# Patient Record
Sex: Female | Born: 1960 | Race: White | Hispanic: No | Smoking: Never smoker
Health system: Southern US, Community
[De-identification: ages and names within clinical notes are randomized; demographics above are authoritative.]

## PROBLEM LIST (undated history)

## (undated) DIAGNOSIS — F32A Depression, unspecified: Secondary | ICD-10-CM

## (undated) DIAGNOSIS — E079 Disorder of thyroid, unspecified: Secondary | ICD-10-CM

## (undated) DIAGNOSIS — F329 Major depressive disorder, single episode, unspecified: Secondary | ICD-10-CM

## (undated) HISTORY — PX: OTHER SURGICAL HISTORY: SHX169

## (undated) HISTORY — DX: Disorder of thyroid, unspecified: E07.9

## (undated) HISTORY — DX: Major depressive disorder, single episode, unspecified: F32.9

## (undated) HISTORY — DX: Depression, unspecified: F32.A

---

## 1999-01-30 ENCOUNTER — Other Ambulatory Visit: Admission: RE | Admit: 1999-01-30 | Discharge: 1999-01-30 | Payer: Self-pay | Admitting: Obstetrics and Gynecology

## 1999-12-17 ENCOUNTER — Ambulatory Visit (HOSPITAL_COMMUNITY): Admission: RE | Admit: 1999-12-17 | Discharge: 1999-12-17 | Payer: Self-pay | Admitting: Obstetrics and Gynecology

## 1999-12-17 ENCOUNTER — Encounter (INDEPENDENT_AMBULATORY_CARE_PROVIDER_SITE_OTHER): Payer: Self-pay

## 2000-01-23 ENCOUNTER — Encounter: Admission: RE | Admit: 2000-01-23 | Discharge: 2000-01-23 | Payer: Self-pay | Admitting: Sports Medicine

## 2000-02-16 ENCOUNTER — Encounter: Admission: RE | Admit: 2000-02-16 | Discharge: 2000-02-16 | Payer: Self-pay | Admitting: Family Medicine

## 2000-03-08 ENCOUNTER — Other Ambulatory Visit: Admission: RE | Admit: 2000-03-08 | Discharge: 2000-03-08 | Payer: Self-pay | Admitting: Obstetrics and Gynecology

## 2000-03-19 ENCOUNTER — Encounter: Admission: RE | Admit: 2000-03-19 | Discharge: 2000-03-19 | Payer: Self-pay | Admitting: Sports Medicine

## 2000-10-11 ENCOUNTER — Ambulatory Visit (HOSPITAL_COMMUNITY): Admission: RE | Admit: 2000-10-11 | Discharge: 2000-10-11 | Payer: Self-pay | Admitting: Endocrinology

## 2000-10-12 ENCOUNTER — Encounter: Payer: Self-pay | Admitting: Endocrinology

## 2001-05-24 ENCOUNTER — Other Ambulatory Visit: Admission: RE | Admit: 2001-05-24 | Discharge: 2001-05-24 | Payer: Self-pay | Admitting: Obstetrics and Gynecology

## 2003-02-20 ENCOUNTER — Other Ambulatory Visit: Admission: RE | Admit: 2003-02-20 | Discharge: 2003-02-20 | Payer: Self-pay | Admitting: Obstetrics and Gynecology

## 2003-12-14 ENCOUNTER — Encounter: Admission: RE | Admit: 2003-12-14 | Discharge: 2003-12-14 | Payer: Self-pay | Admitting: Sports Medicine

## 2004-02-05 ENCOUNTER — Encounter (INDEPENDENT_AMBULATORY_CARE_PROVIDER_SITE_OTHER): Payer: Self-pay | Admitting: *Deleted

## 2004-02-05 ENCOUNTER — Ambulatory Visit (HOSPITAL_COMMUNITY): Admission: RE | Admit: 2004-02-05 | Discharge: 2004-02-05 | Payer: Self-pay | Admitting: Plastic Surgery

## 2004-02-05 ENCOUNTER — Ambulatory Visit (HOSPITAL_BASED_OUTPATIENT_CLINIC_OR_DEPARTMENT_OTHER): Admission: RE | Admit: 2004-02-05 | Discharge: 2004-02-05 | Payer: Self-pay | Admitting: Plastic Surgery

## 2008-01-03 ENCOUNTER — Ambulatory Visit: Payer: Self-pay | Admitting: Sports Medicine

## 2008-01-03 DIAGNOSIS — M25559 Pain in unspecified hip: Secondary | ICD-10-CM | POA: Insufficient documentation

## 2008-01-03 DIAGNOSIS — M7750 Other enthesopathy of unspecified foot: Secondary | ICD-10-CM | POA: Insufficient documentation

## 2008-11-22 ENCOUNTER — Ambulatory Visit: Payer: Self-pay | Admitting: Sports Medicine

## 2008-11-22 DIAGNOSIS — M2170 Unequal limb length (acquired), unspecified site: Secondary | ICD-10-CM | POA: Insufficient documentation

## 2008-12-05 ENCOUNTER — Encounter: Admission: RE | Admit: 2008-12-05 | Discharge: 2008-12-05 | Payer: Self-pay | Admitting: Psychiatry

## 2008-12-05 ENCOUNTER — Ambulatory Visit: Payer: Self-pay | Admitting: Sports Medicine

## 2008-12-13 ENCOUNTER — Telehealth (INDEPENDENT_AMBULATORY_CARE_PROVIDER_SITE_OTHER): Payer: Self-pay | Admitting: *Deleted

## 2008-12-15 ENCOUNTER — Encounter: Admission: RE | Admit: 2008-12-15 | Discharge: 2008-12-15 | Payer: Self-pay | Admitting: Sports Medicine

## 2008-12-24 ENCOUNTER — Ambulatory Visit: Payer: Self-pay | Admitting: Sports Medicine

## 2008-12-24 DIAGNOSIS — M84353A Stress fracture, unspecified femur, initial encounter for fracture: Secondary | ICD-10-CM | POA: Insufficient documentation

## 2009-01-11 ENCOUNTER — Encounter: Admission: RE | Admit: 2009-01-11 | Discharge: 2009-01-11 | Payer: Self-pay | Admitting: Family Medicine

## 2009-01-14 ENCOUNTER — Ambulatory Visit: Payer: Self-pay | Admitting: Sports Medicine

## 2009-02-11 ENCOUNTER — Ambulatory Visit: Payer: Self-pay | Admitting: Sports Medicine

## 2010-07-21 ENCOUNTER — Encounter: Payer: Self-pay | Admitting: Gastroenterology

## 2010-07-31 NOTE — Letter (Signed)
Summary: [facility] Instructions  [facility]  [address] [place], [place] [address]   Phone: [phone]  Fax: [phone]       Wanda Riley    03/01/1961    MRN: [id number]        Procedure Day Dorna Bloom:  Duanne Limerick  08/04/10     Arrival Time:  8:30AM     Procedure Time:  9:30AM     [facility] of Procedure:                    [facility] ([facility])                      PREPARATION FOR COLONOSCOPY WITH MOVIPREP   Starting 5 days prior to your procedure 07/30/10 do not eat nuts, seeds, popcorn, corn, beans, peas,  salads, or any raw vegetables.  Do not take any fiber supplements (e.g. Metamucil, Citrucel, and Benefiber).  THE DAY BEFORE YOUR PROCEDURE         DATE: 08/03/10   DAY: SUNDAY  1.  Drink clear liquids the entire day-NO SOLID FOOD  2.  Do not drink anything colored red or purple.  Avoid juices with pulp.  No orange juice.  3.  Drink at least 64 oz. (8 glasses) of fluid/clear liquids during the day to prevent dehydration and help the prep work efficiently.  CLEAR LIQUIDS INCLUDE: Water Jello Ice Popsicles Tea (sugar ok, no milk/cream) Powdered fruit flavored drinks Coffee (sugar ok, no milk/cream) Gatorade Juice: apple, white grape, white cranberry  Lemonade Clear bullion, consomm, broth Carbonated beverages (any kind) Strained chicken noodle soup Hard Candy                             4.  In the morning, mix first dose of MoviPrep solution:    Empty 1 Pouch A and 1 Pouch B into the disposable container    Add lukewarm drinking water to the top line of the container. Mix to dissolve    Refrigerate (mixed solution should be used within 24 hrs)  5.  Begin drinking the prep at 5:00 p.m. The MoviPrep container is divided by 4 marks.   Every 15 minutes drink the solution down to the next mark (approximately 8 oz) until the full liter is complete.   6.  Follow completed prep with 16 oz of clear liquid of your choice (Nothing red or  purple).  Continue to drink clear liquids until bedtime.  7.  Before going to bed, mix second dose of MoviPrep solution:    Empty 1 Pouch A and 1 Pouch B into the disposable container    Add lukewarm drinking water to the top line of the container. Mix to dissolve    Refrigerate  THE DAY OF YOUR PROCEDURE      DATE: 08/04/10  DAY: MONDAY  Beginning at 4:30AM (5 hours before procedure):         1. Every 15 minutes, drink the solution down to the next mark (approx 8 oz) until the full liter is complete.  2. Follow completed prep with 16 oz. of clear liquid of your choice.    3. You may drink clear liquids until 7:30AM (2 HOURS BEFORE PROCEDURE).   MEDICATION INSTRUCTIONS  Unless otherwise instructed, you should take regular prescription medications with a small sip of water   as early as possible the morning of  your procedure.       OTHER INSTRUCTIONS  You will need a responsible adult at least 51 years of age to accompany you and drive you home.   This person must remain in the waiting room during your procedure.  Wear loose fitting clothing that is easily removed.  Leave jewelry and other valuables at home.  However, you may wish to bring a book to read or  an iPod/MP3 player to listen to music as you wait for your procedure to start.  Remove all body piercing jewelry and leave at home.  Total time from sign-in until discharge is approximately 2-3 hours.  You should go home directly after your procedure and rest.  You can resume normal activities the  day after your procedure.  The day of your procedure you should not:   Drive   Make legal decisions   Operate machinery   Drink alcohol   Return to work  You will receive specific instructions about eating, activities and medications before you leave.    The above instructions have been reviewed and explained to me by   Doristine Church RN II  July 21, 2010 11:39 AM _______________________    I fully  understand and can verbalize these instructions _____________________________ Date _________

## 2010-07-31 NOTE — Miscellaneous (Signed)
Summary: [facility]  Clinical Lists Changes  Medications: Added new medication of MOVIPREP 100 GM  SOLR (PEG-KCL-NACL-NASULF-NA ASC-C) As per prep instructions. - Signed Rx of MOVIPREP 100 GM  SOLR (PEG-KCL-NACL-NASULF-NA ASC-C) As per prep instructions.;  #1 x 0;  Signed;  Entered by: Doristine Church RN II;  Authorized by: Mardella Layman MD [facility];  Method used: Electronically to [facility] [address], [place], [place], [place]  [address], Ph: [phone], Fax: [phone] Observations: Added new observation of ALLERGY REV: Done (07/21/2010 11:18) Added new observation of NKA: T (07/21/2010 11:18)    Prescriptions: MOVIPREP 100 GM  SOLR (PEG-KCL-NACL-NASULF-NA ASC-C) As per prep instructions.  #1 x 0   Entered by:   Doristine Church RN II   Authorized by:   Mardella Layman MD [facility]   Signed by:   Doristine Church RN II on 07/21/2010   Method used:   Electronically to        [facility]       [address]       [place], [place]  [address]       Ph: [phone]       Fax: [phone]   RxID:   [id number]

## 2010-08-04 ENCOUNTER — Encounter: Payer: Self-pay | Admitting: Gastroenterology

## 2010-08-04 ENCOUNTER — Ambulatory Visit: Payer: BC Managed Care – PPO | Admitting: Gastroenterology

## 2010-08-04 VITALS — BP 106/69 | HR 68 | Temp 98.2°F | Resp 18 | Ht 65.5 in | Wt 160.0 lb

## 2010-08-04 DIAGNOSIS — D126 Benign neoplasm of colon, unspecified: Secondary | ICD-10-CM

## 2010-08-04 DIAGNOSIS — Z1211 Encounter for screening for malignant neoplasm of colon: Secondary | ICD-10-CM

## 2010-08-04 DIAGNOSIS — K635 Polyp of colon: Secondary | ICD-10-CM

## 2010-08-04 DIAGNOSIS — R6889 Other general symptoms and signs: Secondary | ICD-10-CM

## 2010-08-04 NOTE — Patient Instructions (Signed)
Discharge instructions given with verbal understanding. Handout on polyps given. 

## 2010-08-05 ENCOUNTER — Telehealth: Payer: Self-pay

## 2010-08-05 NOTE — Telephone Encounter (Signed)

## 2010-08-11 ENCOUNTER — Encounter: Payer: Self-pay | Admitting: Gastroenterology

## 2010-09-19 NOTE — Op Note (Signed)
Wanda Riley, Wanda Riley                 ACCOUNT NO.:  [id number]   MEDICAL RECORD NO.:  [id number]          PATIENT TYPE:  AMB   LOCATION:  DSC                          FACILITY:  [facility]   PHYSICIAN:  Alfredia Ferguson, M.D.  DATE OF BIRTH:  1960/08/12   DATE OF PROCEDURE:  02/05/2004  DATE OF DISCHARGE:                                 OPERATIVE REPORT   PREOPERATIVE DIAGNOSIS:  A 1 cm sebaceous cyst, presternal area.   POSTOPERATIVE DIAGNOSIS:  A 1 cm sebaceous cyst, presternal area.   OPERATION PERFORMED:  Excision, cyst, presternal area.   SURGEON:  Alfredia Ferguson, M.D.   ANESTHESIA:  2% Xylocaine, 1:100,000 epinephrine.   INDICATIONS FOR PROCEDURE:  The patient is a 50 year old woman with slowly  enlarging subcutaneous mass.  Clinically, it appears to be a sebaceous cyst.  It has been infected in the past.  She would like to have it removed before  it gets infected again.  The patient understands she will be trading what  she has for a permanent and potentially unsightly scar.  In spite of that,  she wishes to proceed with the surgery.   DESCRIPTION OF PROCEDURE:  Skin markers were placed in elliptical fashion  around the lesion to include the diseased punctum.  Local anesthesia was  infiltrated and the area was prepped with Betadine and draped with sterile  drapes.  An elliptical incision in the skin was made and deepened until  reaching the capsule of the cyst.  Using sharp dissection, the cyst was  dissected away from the surrounding tissue.  The cyst was removed in toto.  Hemostasis was accomplished using pressure.  The wound was closed using  interrupted 5-0 Monocryl sutures for the dermis.  The area was cleansed and  dried and Steri-Strips were applied to the incision.  Light dressing was  applied.  The patient was discharged to home in satisfactory condition.       WBB/MEDQ  D:  02/05/2004  T:  02/05/2004  Job:  [id number]

## 2010-09-19 NOTE — Op Note (Signed)
[facility]  Patient:    Wanda Riley, Wanda Riley                        MRN: [id number] Proc. Date: 12/17/99 Adm. Date:  [id number] Attending:  Morene Antu                           Operative Report  PREOPERATIVE DIAGNOSES:       1. Menorrhagia.                               2. Thickened endometrium.  POSTOPERATIVE DIAGNOSES:      1. Menorrhagia.                               2. Thickened endometrium.                               3. Cystic polypoid endometrium.  PROCEDURE:                    1. Dilatation and curettage.                               2. Hysteroscopy with resectoscope.  SURGEON:                      Sherry A. Rosalio Macadamia, M.D.  ANESTHESIA:                   General.  INDICATIONS:                  This is a 50 year old G 4, P 4, 0, 0, 4 woman who has been having excessively heavy flow with her menstrual period.  She has been having more frequent menstrual periods and also lasting significantly longer.  In July the patient had an 18-day cycle.  The patient was treated with Lo/Ovral to try to decrease her bleeding; however, this was not adequate. Because of this, the patient had an ultrasound which revealed a slightly enlarged uterus with a thickened endometrium approximately 18.0 mm.  Because of this, the patient is brought to the operating room for a D&C, hysteroscopy, with resectoscope.  FINDINGS:                     An anteflexed uterus approximately nine weeks size, normal adnexa, excessive cystic polypoid endometrial tissue present.  DESCRIPTION OF PROCEDURE:     The patient was brought into the operating room and given adequate general anesthesia.  She was placed in the dorsal lithotomy position.  Her perineum and vagina were washed with Betadine.  A pelvic examination was performed.  The bladder was felt to be empty.  The patient was draped in a sterile fashion.  The speculum was  placed within the vagina.  The cervix was  grasped with a single-tooth tenaculum.  The cervix was sounded and dilated with Pratt dilators to a #31.  The hysteroscope was easily introduced into the endometrial cavity.  Pictures were obtained using a double loop right angle resector.  The endometrial tissue was resected in sheets circumferentially.  Several layers of tissue were needed to be resected to be  able to get through the endometrial tissue.  Once resection was completed circumferentially, all instruments were removed from the vagina.  The patient was taken out of the dorsal lithotomy position.  The patient was awakened. She was extubated.  She was moved from the operating room table to a stretcher in stable condition.  COMPLICATIONS:                None.  ESTIMATED BLOOD LOSS:         5 cc.  This is Sorbitol differential -130. DD:  12/17/99 TD:  12/17/99 Job: [id number]

## 2011-04-17 ENCOUNTER — Encounter (INDEPENDENT_AMBULATORY_CARE_PROVIDER_SITE_OTHER): Payer: BC Managed Care – PPO

## 2011-04-17 DIAGNOSIS — E039 Hypothyroidism, unspecified: Secondary | ICD-10-CM

## 2011-05-28 ENCOUNTER — Encounter (INDEPENDENT_AMBULATORY_CARE_PROVIDER_SITE_OTHER): Payer: BC Managed Care – PPO | Admitting: Family Medicine

## 2011-05-28 ENCOUNTER — Encounter: Payer: Self-pay | Admitting: Family Medicine

## 2011-05-28 DIAGNOSIS — F32A Depression, unspecified: Secondary | ICD-10-CM

## 2011-05-28 DIAGNOSIS — Z0000 Encounter for general adult medical examination without abnormal findings: Secondary | ICD-10-CM

## 2011-05-28 DIAGNOSIS — F329 Major depressive disorder, single episode, unspecified: Secondary | ICD-10-CM | POA: Insufficient documentation

## 2011-05-28 DIAGNOSIS — Z23 Encounter for immunization: Secondary | ICD-10-CM

## 2011-05-28 DIAGNOSIS — E039 Hypothyroidism, unspecified: Secondary | ICD-10-CM

## 2012-05-07 ENCOUNTER — Other Ambulatory Visit: Payer: Self-pay | Admitting: Family Medicine

## 2012-05-07 NOTE — Telephone Encounter (Signed)
Please pull chart.

## 2012-05-09 NOTE — Telephone Encounter (Signed)
Chart pulled to PA pool at nurses station [phone]

## 2012-06-09 ENCOUNTER — Telehealth: Payer: Self-pay | Admitting: *Deleted

## 2012-06-09 ENCOUNTER — Other Ambulatory Visit: Payer: Self-pay | Admitting: *Deleted

## 2012-06-09 MED ORDER — LEVOTHYROXINE SODIUM 75 MCG PO TABS: 75.0000 ug | ORAL_TABLET | Freq: Every day | ORAL | Status: DC

## 2012-06-09 MED ORDER — BUPROPION HCL ER (XL) 150 MG PO TB24: 150.0000 mg | ORAL_TABLET | Freq: Every day | ORAL | Status: DC

## 2012-06-09 NOTE — Telephone Encounter (Signed)
[facility] requesting refill on bupropion xl 150.  Last fill 05/07/12

## 2012-06-15 ENCOUNTER — Ambulatory Visit (INDEPENDENT_AMBULATORY_CARE_PROVIDER_SITE_OTHER): Payer: BC Managed Care – PPO | Admitting: Family Medicine

## 2012-06-15 ENCOUNTER — Encounter: Payer: Self-pay | Admitting: Family Medicine

## 2012-06-15 VITALS — BP 100/66 | HR 77 | Temp 98.2°F | Resp 16 | Ht 65.0 in | Wt 159.0 lb

## 2012-06-15 DIAGNOSIS — F418 Other specified anxiety disorders: Secondary | ICD-10-CM

## 2012-06-15 DIAGNOSIS — F341 Dysthymic disorder: Secondary | ICD-10-CM

## 2012-06-15 DIAGNOSIS — E039 Hypothyroidism, unspecified: Secondary | ICD-10-CM

## 2012-06-15 DIAGNOSIS — E7800 Pure hypercholesterolemia, unspecified: Secondary | ICD-10-CM

## 2012-06-15 LAB — COMPREHENSIVE METABOLIC PANEL
ALT: 12 U/L (ref 0–35)
AST: 16 U/L (ref 0–37)
Alkaline Phosphatase: 70 U/L (ref 39–117)
CO2: 30 mEq/L (ref 19–32)
Sodium: 141 mEq/L (ref 135–145)
Total Bilirubin: 0.5 mg/dL (ref 0.3–1.2)
Total Protein: 6.7 g/dL (ref 6.0–8.3)

## 2012-06-15 LAB — LIPID PANEL
LDL Cholesterol: 156 mg/dL — ABNORMAL HIGH (ref 0–99)
VLDL: 17 mg/dL (ref 0–40)

## 2012-06-15 LAB — TSH: TSH: 1.447 u[IU]/mL (ref 0.350–4.500)

## 2012-06-15 LAB — T3, FREE: T3, Free: 2.9 pg/mL (ref 2.3–4.2)

## 2012-06-15 MED ORDER — BUPROPION HCL ER (XL) 150 MG PO TB24: 150.0000 mg | ORAL_TABLET | Freq: Every day | ORAL | Status: DC

## 2012-06-15 MED ORDER — LEVOTHYROXINE SODIUM 75 MCG PO TABS: 75.0000 ug | ORAL_TABLET | Freq: Every day | ORAL | Status: DC

## 2012-06-15 NOTE — Patient Instructions (Addendum)

## 2012-06-15 NOTE — Progress Notes (Signed)
S:  This 52 y.o. Cauc female has hypothyroidism, chronic depression and elevated cholesterol (most likely familial- father has high cholesterol). She feels good but has a "cold" today and feels like she is overweight. She would like to lose  ~ 10 -15 pounds in next 3 months. She is compliant w/ all medications and sees a therapist as part of treatment for depression. She is employed by [employer] and will be traveling to [place] in May; she handles job-related stress well and is sleeping well. Pt has some concern about elevated cholesterol but is aware that HDL if high; there is a familial component since one of her parents has hypercholesterolemia. She is not interested in taking a statin.  ROS: As per HPI; negative for fatigue, fever/chills, unexplained weight change, CP or tightness, palpitations, SOB or DOE, constipation, skin changes, HA, dizziness, weakness or syncope. She has no SI/HI, agitation or concentration problems.  O: Filed Vitals:   06/15/12 0748  BP: 100/66  Pulse: 77  Temp: 98.2 F (36.8 C)  Resp: 16   GEN: In NAD; WN,WD.  HENT: [place]/AT. EOMI w/ clear conj/sclerae. Wears corrective lenses. EACs normal. Oroph clear and moist. Otherwise normal. COR: RRR. LUNGS: Normal resp rate and effort. SKIN: W&D; no pallor. MS: MAEs; no c/c/e. NEURO: A&O x 3; CNs intact. Nonfocal. Mentation- somewhat flat affect.  Jan 2013 Labs:  TSH= 1.807   TChol= 257   TGs= 77   HDL= 70    LDL= 172    TChol/HDL ratio= 3.7  A/P:  1. Unspecified hypothyroidism  Refill Levothyroxine 75 mcg  1 tab daily #30 w/ 2 RF   TSH   T3, free  2. Hypercholesteremia - risk of CHD ~ 1/2 average Lipid panel   Try OTC Fish Oil- pt given samples of Mega Red Krill Oil caps  3. Depression with anxiety  Refill Bupropion   Comprehensive metabolic panel   Meds ordered this encounter  Medications  . levothyroxine (SYNTHROID, LEVOTHROID) 75 MCG tablet    Sig: Take 1 tablet (75 mcg total) by mouth daily.    Dispense:  30  tablet    Refill:  2  . buPROPion (WELLBUTRIN XL) 150 MG 24 hr tablet    Sig: Take 1 tablet (150 mg total) by mouth daily.    Dispense:  30 tablet    Refill:  5            Follow -up in May 2014 w/ Dr. Milus Glazier for CPE as scheduled.

## 2012-06-18 ENCOUNTER — Encounter: Payer: Self-pay | Admitting: Family Medicine

## 2012-06-19 ENCOUNTER — Other Ambulatory Visit: Payer: Self-pay

## 2012-09-15 ENCOUNTER — Ambulatory Visit (INDEPENDENT_AMBULATORY_CARE_PROVIDER_SITE_OTHER): Payer: BC Managed Care – PPO | Admitting: Family Medicine

## 2012-09-15 ENCOUNTER — Encounter: Payer: Self-pay | Admitting: Family Medicine

## 2012-09-15 VITALS — BP 98/65 | HR 69 | Temp 96.4°F | Resp 16 | Ht 65.25 in | Wt 158.6 lb

## 2012-09-15 DIAGNOSIS — Z0000 Encounter for general adult medical examination without abnormal findings: Secondary | ICD-10-CM

## 2012-09-15 NOTE — Progress Notes (Signed)
  Subjective:    Patient ID: Wanda Riley, female    DOB: 04-14-1961, 52 y.o.   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  Patient had PAP January and was told no need for another x 5 years  Patient had visual disturbance originally thought to be a virus (Dr. Elmer Picker) and later thought to be allergy (Dr. Emily Filbert) and resolving with allergy meds.  Exercise:  Riding bike, usually on weekends  Review of Systems  Eyes: Positive for visual disturbance.       Objective:   Physical Exam Cheerful, good eye contact, appropriate HEENT: unremarkable Chest:  Clear Heart:  Regular, no murmur or gallop Back:  Normal contour, no skin lesions Abdomen: soft, nontender with no HSM, no masses Extremities:  Normal with good pulses, no edema      Assessment & Plan:  Stable. No labs necessary.  We discussed the chronically elevated cholesterol. Recent studies have shown no improvement in treating cholesterol levels and women. Patient has no other risk factors for heart disease and so we agreed to leave this as is, taking only fish oil.  Plan: Yearly followup Patient resistant to mammograms with conflicting data about this is a preventative. She's up-to-date on her colonoscopy. Meds have been refilled.

## 2012-09-21 ENCOUNTER — Other Ambulatory Visit: Payer: Self-pay | Admitting: Family Medicine

## 2013-01-19 ENCOUNTER — Other Ambulatory Visit: Payer: Self-pay | Admitting: Family Medicine

## 2013-03-04 ENCOUNTER — Other Ambulatory Visit: Payer: Self-pay | Admitting: Family Medicine

## 2013-03-09 ENCOUNTER — Other Ambulatory Visit: Payer: Self-pay

## 2013-05-23 ENCOUNTER — Telehealth: Payer: Self-pay

## 2013-05-23 MED ORDER — LEVOTHYROXINE SODIUM 75 MCG PO TABS: ORAL_TABLET | ORAL | Status: DC

## 2013-05-23 NOTE — Telephone Encounter (Signed)
Pharm called reporting that they are sending RF req electronically but we are receiving faxes. Gave OK for 1 RF of levothyroxine on phone w/note that pt needs OV for more.

## 2014-01-16 ENCOUNTER — Ambulatory Visit (INDEPENDENT_AMBULATORY_CARE_PROVIDER_SITE_OTHER): Payer: BC Managed Care – PPO | Admitting: Family Medicine

## 2014-01-16 VITALS — BP 100/76 | HR 63 | Temp 97.6°F | Resp 16 | Ht 65.5 in | Wt 152.4 lb

## 2014-01-16 DIAGNOSIS — J0100 Acute maxillary sinusitis, unspecified: Secondary | ICD-10-CM

## 2014-01-16 DIAGNOSIS — H1045 Other chronic allergic conjunctivitis: Secondary | ICD-10-CM

## 2014-01-16 DIAGNOSIS — H1012 Acute atopic conjunctivitis, left eye: Secondary | ICD-10-CM

## 2014-01-16 MED ORDER — OLOPATADINE HCL 0.1 % OP SOLN: 1.0000 [drp] | Freq: Two times a day (BID) | OPHTHALMIC | Status: DC

## 2014-01-16 MED ORDER — AZITHROMYCIN 250 MG PO TABS: ORAL_TABLET | ORAL | Status: DC

## 2014-01-16 NOTE — Progress Notes (Signed)
Patient ID: Wanda Riley MRN: [id number], DOB: November 27, 1960, 53 y.o. Date of Encounter: 01/16/2014, 5:05 PM  Primary Physician: Robyn Haber, MD  Chief Complaint:  Chief Complaint  Patient presents with  . Facial Pain    pt states her face hurts;  . Sinus Problem    pt states she has lots of pressure  . Eye Pain    pt is having left eye pain  . other    eczema    HPI: 53 y.o. year old female presents with 5 day history of nasal congestion, post nasal drip, sore throat, sinus pressure, and cough. Afebrile. No chills. Nasal congestion thick and green/yellow. Sinus pressure is the worst symptom. Cough is productive secondary to post nasal drip and not associated with time of day. Ears feel full, leading to sensation of muffled hearing. Has tried OTC cold preps without success. No GI complaints.   No recent antibiotics, recent travels, vomiting, or sick contacts   No leg trauma, sedentary periods, h/o cancer, or tobacco use.  Past Medical History  Diagnosis Date  . Depression   . Thyroid disease      Home Meds: Prior to Admission medications   Medication Sig Start Date End Date Taking? Authorizing Provider  levothyroxine (SYNTHROID, LEVOTHROID) 75 MCG tablet TAKE 1 TABLET DAILY. PATIENT NEEDS OFFICE VISIT FOR ADDITIONAL REFILLS 05/23/13  Yes Rise Mu, PA-C    Allergies: No Known Allergies  History   Social History  . Marital Status: Married    Spouse Name: N/A    Number of Children: N/A  . Years of Education: N/A   Occupational History  . Not on file.   Social History Main Topics  . Smoking status: Never Smoker   . Smokeless tobacco: Not on file  . Alcohol Use: 0.5 oz/week    1 drink(s) per week  . Drug Use: No  . Sexual Activity: No   Other Topics Concern  . Not on file   Social History Narrative  . No narrative on file     Review of Systems: Constitutional: negative for chills, fever, night sweats or weight changes Cardiovascular: negative for  chest pain or palpitations Respiratory: negative for hemoptysis, wheezing, or shortness of breath Abdominal: negative for abdominal pain, nausea, vomiting or diarrhea Dermatological: negative for rash Neurologic: negative for headache   Physical Exam: Blood pressure 100/76, pulse 63, temperature 97.6 F (36.4 C), temperature source Oral, resp. rate 16, height 5' 5.5" (1.664 m), weight 152 lb 6.4 oz (69.128 kg), SpO2 99.00%., Body mass index is 24.97 kg/(m^2). General: Well developed, well nourished, in no acute distress. Head: Normocephalic, atraumatic, eyes without discharge, sclera non-icteric, nares are congested. Bilateral auditory canals clear, TM's are without perforation, pearly grey with reflective cone of light bilaterally. Serous effusion bilaterally behind TM's. Maxillary sinus TTP. Oral cavity moist, dentition normal. Posterior pharynx with post nasal drip and mild erythema. No peritonsillar abscess or tonsillar exudate. Neck: Supple. No thyromegaly. Full ROM. No lymphadenopathy. Lungs: Clear bilaterally to auscultation without wheezes, rales, or rhonchi. Breathing is unlabored.  Heart: RRR with S1 S2. No murmurs, rubs, or gallops appreciated. Msk:  Strength and tone normal for age. Extremities: No clubbing or cyanosis. No edema. Neuro: Alert and oriented X 3. Moves all extremities spontaneously. CNII-XII grossly in tact. Psych:  Responds to questions appropriately with a normal affect.     ASSESSMENT AND PLAN:  53 y.o. year old female with sinusitis -Allergic conjunctivitis, left - Plan: olopatadine (PATANOL) 0.1 %  ophthalmic solution  Acute maxillary sinusitis, recurrence not specified - Plan: azithromycin (ZITHROMAX Z-PAK) 250 MG tablet    -Tylenol/Motrin prn -Rest/fluids -RTC precautions -RTC 3-5 days if no improvement  Signed, Robyn Haber, MD 01/16/2014 5:05 PM

## 2014-02-16 ENCOUNTER — Other Ambulatory Visit: Payer: Self-pay

## 2014-03-19 ENCOUNTER — Encounter: Payer: Self-pay | Admitting: Family Medicine

## 2014-03-19 ENCOUNTER — Ambulatory Visit (INDEPENDENT_AMBULATORY_CARE_PROVIDER_SITE_OTHER): Payer: BC Managed Care – PPO | Admitting: Family Medicine

## 2014-03-19 VITALS — BP 110/76 | HR 79 | Temp 98.0°F | Resp 16 | Ht 65.25 in | Wt 152.4 lb

## 2014-03-19 DIAGNOSIS — R519 Headache, unspecified: Secondary | ICD-10-CM

## 2014-03-19 DIAGNOSIS — R51 Headache: Secondary | ICD-10-CM

## 2014-03-19 DIAGNOSIS — E039 Hypothyroidism, unspecified: Secondary | ICD-10-CM

## 2014-03-19 DIAGNOSIS — H1012 Acute atopic conjunctivitis, left eye: Secondary | ICD-10-CM

## 2014-03-19 DIAGNOSIS — E785 Hyperlipidemia, unspecified: Secondary | ICD-10-CM

## 2014-03-19 DIAGNOSIS — Z0000 Encounter for general adult medical examination without abnormal findings: Secondary | ICD-10-CM

## 2014-03-19 DIAGNOSIS — Z23 Encounter for immunization: Secondary | ICD-10-CM

## 2014-03-19 LAB — COMPLETE METABOLIC PANEL WITH GFR
ALT: 11 U/L (ref 0–35)
AST: 17 U/L (ref 0–37)
Albumin: 4.7 g/dL (ref 3.5–5.2)
Alkaline Phosphatase: 64 U/L (ref 39–117)
BUN: 11 mg/dL (ref 6–23)
CO2: 31 mEq/L (ref 19–32)
Calcium: 9.6 mg/dL (ref 8.4–10.5)
Chloride: 99 mEq/L (ref 96–112)
Creat: 0.61 mg/dL (ref 0.50–1.10)
GFR, Est African American: >89 mL/min
GFR, Est Non African American: >89 mL/min
Glucose, Bld: 90 mg/dL (ref 70–99)
Potassium: 4.2 mEq/L (ref 3.5–5.3)
Sodium: 139 mEq/L (ref 135–145)
Total Bilirubin: 0.6 mg/dL (ref 0.2–1.2)
Total Protein: 7.3 g/dL (ref 6.0–8.3)

## 2014-03-19 LAB — POCT URINALYSIS DIPSTICK
Bilirubin, UA: NEGATIVE
Glucose, UA: NEGATIVE
Ketones, UA: NEGATIVE
Leukocytes, UA: NEGATIVE
Nitrite, UA: NEGATIVE
Protein, UA: NEGATIVE
Spec Grav, UA: 1.01
Urobilinogen, UA: 0.2
pH, UA: 7

## 2014-03-19 LAB — CBC WITH DIFFERENTIAL/PLATELET
Basophils Absolute: 0 10*3/uL (ref 0.0–0.1)
Basophils Relative: 1 % (ref 0–1)
Eosinophils Absolute: 0.2 10*3/uL (ref 0.0–0.7)
Eosinophils Relative: 4 % (ref 0–5)
HCT: 41.1 % (ref 36.0–46.0)
Hemoglobin: 13.8 g/dL (ref 12.0–15.0)
Lymphocytes Relative: 38 % (ref 12–46)
Lymphs Abs: 1.6 10*3/uL (ref 0.7–4.0)
MCH: 29.4 pg (ref 26.0–34.0)
MCHC: 33.6 g/dL (ref 30.0–36.0)
MCV: 87.6 fL (ref 78.0–100.0)
MPV: 9.4 fL (ref 9.4–12.4)
Monocytes Absolute: 0.4 10*3/uL (ref 0.1–1.0)
Monocytes Relative: 9 % (ref 3–12)
Neutro Abs: 2 10*3/uL (ref 1.7–7.7)
Neutrophils Relative %: 48 % (ref 43–77)
Platelets: 288 10*3/uL (ref 150–400)
RBC: 4.69 MIL/uL (ref 3.87–5.11)
RDW: 13.5 % (ref 11.5–15.5)
WBC: 4.2 10*3/uL (ref 4.0–10.5)

## 2014-03-19 LAB — LIPID PANEL
Cholesterol: 259 mg/dL — ABNORMAL HIGH (ref 0–200)
HDL: 92 mg/dL (ref >39)
LDL Cholesterol: 151 mg/dL — ABNORMAL HIGH (ref 0–99)
Total CHOL/HDL Ratio: 2.8 Ratio
Triglycerides: 79 mg/dL (ref <150)
VLDL: 16 mg/dL (ref 0–40)

## 2014-03-19 LAB — TSH: TSH: 1.58 u[IU]/mL (ref 0.350–4.500)

## 2014-03-19 MED ORDER — LEVOTHYROXINE SODIUM 75 MCG PO TABS: ORAL_TABLET | ORAL | Status: DC

## 2014-03-19 NOTE — Progress Notes (Signed)
   Subjective:    Patient ID: Wanda Riley, female    DOB: Sep 21, 1960, 53 y.o.   MRN: [id number]  HPI    Review of Systems  Constitutional: Positive for fatigue.  HENT: Positive for sinus pressure.   Eyes: Positive for redness.  Respiratory: Negative.   Cardiovascular: Negative.   Gastrointestinal: Negative.   Endocrine: Negative.   Genitourinary: Negative.   Musculoskeletal: Negative.   Skin: Negative.   Allergic/Immunologic: Negative.   Neurological: Negative.   Hematological: Negative.   Psychiatric/Behavioral: Negative.        Objective:   Physical Exam        Assessment & Plan:

## 2014-03-19 NOTE — Progress Notes (Signed)
° °  Subjective:    Patient ID: Wanda Riley, female    DOB: Jun 03, 1960, 53 y.o.   MRN: [id number] This chart was scribed for Robyn Haber, MD by Marti Sleigh, Medical Scribe. This patient was seen in Room 25 and the patient's care was started a 10:22 AM.   HPI HPI Comments: Wanda Riley is a 53 y.o. female with a hx of hypothyroidism who presents to [facility] reporting for an annual exam. Pt endorses intermittent redness of the right eye which she associates with allergies. Pt states that she is well, with no other acute symptoms. Pt has received a flu shot. Pt received a colonoscopy on 08/04/2010, which was normal. Pt's cholesterol was checked at last visit, and was within normal limits.   Pt works for [employer].  Previous HPI Comments:  53 y.o. year old female presents with 5 day history of nasal congestion, post nasal drip, sore throat, sinus pressure, and cough. Afebrile. No chills. Nasal congestion thick and green/yellow. Sinus pressure is the worst symptom. Cough is productive secondary to post nasal drip and not associated with time of day. Ears feel full, leading to sensation of muffled hearing. Has tried OTC cold preps without success. No GI complaints.   No recent antibiotics, recent travels, vomiting, or sick contacts   No leg trauma, sedentary periods, h/o cancer, or tobacco use.  Review of Systems  Constitutional: Negative for fever and chills.  Eyes: Positive for redness.  Gastrointestinal: Negative for abdominal pain.  Skin: Negative for wound.  Neurological: Positive for headaches (Frontal.).       Objective:   Physical Exam  Constitutional: She is oriented to person, place, and time. She appears well-developed and well-nourished.  HENT:  Head: Normocephalic and atraumatic.  Eyes: EOM are normal. Pupils are equal, round, and reactive to light. Right eye exhibits no discharge. Left eye exhibits no discharge.  Mildly injected left eye.  Neck: Neck supple. No thyromegaly  present.  Cardiovascular: Normal rate, regular rhythm and normal heart sounds.   Pulmonary/Chest: Effort normal and breath sounds normal. No respiratory distress. She has no wheezes.  Lymphadenopathy:    She has no cervical adenopathy.  Neurological: She is alert and oriented to person, place, and time.  Skin: Skin is warm and dry.  Psychiatric: She has a normal mood and affect. Her behavior is normal.  Nursing note and vitals reviewed. breast exam normal     Assessment & Plan:  Annual physical exam - Plan: POCT urinalysis dipstick, CBC with Differential, COMPLETE METABOLIC PANEL WITH GFR, Lipid panel, TSH  Need for prophylactic vaccination and inoculation against influenza - Plan: Flu Vaccine QUAD 36+ mos IM  Allergic conjunctivitis, left  Hypothyroidism, unspecified hypothyroidism type - Plan: TSH, levothyroxine (SYNTHROID, LEVOTHROID) 75 MCG tablet  Hyperlipidemia - Plan: Lipid panel  Sinus headache - Plan: CT Maxillofacial WO CM  Signed, Robyn Haber, MD

## 2014-03-21 ENCOUNTER — Other Ambulatory Visit: Payer: Self-pay | Admitting: Family Medicine

## 2014-03-21 DIAGNOSIS — E039 Hypothyroidism, unspecified: Secondary | ICD-10-CM

## 2014-03-22 MED ORDER — LEVOTHYROXINE SODIUM 75 MCG PO TABS: ORAL_TABLET | ORAL | Status: DC

## 2014-03-26 ENCOUNTER — Ambulatory Visit
Admission: RE | Admit: 2014-03-26 | Discharge: 2014-03-26 | Disposition: A | Discharge status: Home / Self Care | Payer: BC Managed Care – PPO | Source: Ambulatory Visit | Attending: Family Medicine | Admitting: Family Medicine

## 2014-03-26 DIAGNOSIS — R519 Headache, unspecified: Secondary | ICD-10-CM

## 2014-03-26 DIAGNOSIS — R51 Headache: Principal | ICD-10-CM

## 2014-03-30 ENCOUNTER — Other Ambulatory Visit: Payer: Self-pay | Admitting: Radiology

## 2014-03-30 DIAGNOSIS — J320 Chronic maxillary sinusitis: Secondary | ICD-10-CM

## 2014-09-11 ENCOUNTER — Ambulatory Visit (INDEPENDENT_AMBULATORY_CARE_PROVIDER_SITE_OTHER): Payer: BLUE CROSS/BLUE SHIELD | Admitting: Family Medicine

## 2014-09-11 ENCOUNTER — Telehealth: Payer: Self-pay | Admitting: Family Medicine

## 2014-09-11 VITALS — BP 114/66 | HR 86 | Temp 98.8°F | Resp 18 | Ht 65.0 in | Wt 157.0 lb

## 2014-09-11 DIAGNOSIS — R519 Headache, unspecified: Secondary | ICD-10-CM

## 2014-09-11 DIAGNOSIS — R51 Headache: Secondary | ICD-10-CM

## 2014-09-11 DIAGNOSIS — T148 Other injury of unspecified body region: Secondary | ICD-10-CM | POA: Diagnosis not present

## 2014-09-11 DIAGNOSIS — S73022A Obturator subluxation of left hip, initial encounter: Secondary | ICD-10-CM

## 2014-09-11 DIAGNOSIS — J302 Other seasonal allergic rhinitis: Secondary | ICD-10-CM | POA: Diagnosis not present

## 2014-09-11 DIAGNOSIS — W57XXXA Bitten or stung by nonvenomous insect and other nonvenomous arthropods, initial encounter: Secondary | ICD-10-CM

## 2014-09-11 MED ORDER — FLUTICASONE PROPIONATE 50 MCG/ACT NA SUSP: 2.0000 | Freq: Every day | NASAL | Status: DC

## 2014-09-11 NOTE — Patient Instructions (Signed)
The Flonase nasal spray will take about a week to work. If you have an seen improvement in either the headache or the sinus congestion by that time, let me know and I'll have you see an allergist.  I have written the note for the sit/stand workstation. If this doesn't work for you, let me know and I'll write another letter  I think that the rash you have your left shoulder are insect bite related and should disappear in the next 10 days.

## 2014-09-11 NOTE — Telephone Encounter (Signed)
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 She thinks it is healthy for her hip and back. I advised pt to come in and see Dr. Joseph Art tonight and discuss with him. This is the best way to get him to write a note.

## 2014-09-11 NOTE — Telephone Encounter (Signed)
Patient states that she needs a doctor's note in order for her to have a standing desk at her job. Please call when her note is available for pick up.  [phone]

## 2014-09-11 NOTE — Progress Notes (Signed)
Subjective:    Patient ID: Wanda Riley, female    DOB: 12-May-1960, 54 y.o.   MRN: [id number] This chart was scribed for Robyn Haber, MD by Martinique Peace, ED Scribe. The patient was seen in [facility]03. The patient's care was started at 6:59 PM.  Chief Complaint  Patient presents with   Follow-up    standing desk at work was told to come in      HPI  HPI Comments: Wanda Riley is a 54 y.o. female who presents to the [facility] seeking follow up regarding recurrent left hip pain which she reports has somewhat improved since last visit. Pt states she spends long periods of time sitting at her at work which she thinks is responsible for current pain. She explains that she has requested a standing desk at work but was told she needed to receive a note from her doctor.   Pt also complains of congestion with headache. She reports she was given an antibiotic by an ENT Doctor that she was referred to but denies any improvement in symptoms.   Pt also complains of insect bites to her upper back that she first noticed after arriving back from a trip to [place]. Pt states that she did some hiking while she was there and believes this is when she may have gotten bit.   Past Medical History  Diagnosis Date   Depression    Thyroid disease    History   Social History   Marital Status: Married    Spouse Name: N/A   Number of Children: N/A   Years of Education: N/A   Occupational History   regulatory    Social History Main Topics   Smoking status: Never Smoker    Smokeless tobacco: Not on file   Alcohol Use: 0.6 oz/week    1 Standard drinks or equivalent per week     Comment: glass of wine 3 x a week   Drug Use: No   Sexual Activity: No   Other Topics Concern   Not on file   Social History Narrative   No Known Allergies Prior to Admission medications   Medication Sig Start Date End Date Taking? Authorizing Provider  levothyroxine (SYNTHROID, LEVOTHROID) 75 MCG tablet TAKE 1  TABLET DAILY. 03/22/14  Yes Robyn Haber, MD  loratadine (CLARITIN) 10 MG tablet Take 10 mg by mouth daily.   Yes Historical Provider, MD  olopatadine (PATANOL) 0.1 % ophthalmic solution Place 1 drop into the left eye 2 (two) times daily. Patient not taking: Reported on 09/11/2014 01/16/14   Robyn Haber, MD     Review of Systems  HENT: Positive for congestion.   Musculoskeletal: Positive for arthralgias.       Left hip pain.   Skin: Positive for rash.       Insect bites.   Neurological: Positive for headaches.       Objective:   Physical Exam  Constitutional: She is oriented to person, place, and time. She appears well-developed and well-nourished. No distress.  HENT:  Head: Normocephalic and atraumatic.  Nasal passages are mildly swollen and pale blue.   Eyes: Conjunctivae and EOM are normal.  Narrowed nasal passages without obvious deviation, mildly pale blue mucosa  Neck: Neck supple. No tracheal deviation present.  Cardiovascular: Normal rate.   Pulmonary/Chest: Effort normal. No respiratory distress.  Musculoskeletal: Normal range of motion.  Tender posterior to left trochanter  Neurological: She is alert and oriented to person, place, and time.  Skin:  Skin is warm and dry.  Two small erythematous scaly areas on left shoulder c/w insect bites  Psychiatric: She has a normal mood and affect. Her behavior is normal.  Nursing note and vitals reviewed.     7:07 PM- Treatment plan was discussed with patient who verbalizes understanding and agrees.       Assessment & Plan:   This chart was scribed in my presence and reviewed by me personally.    ICD-9-CM ICD-10-CM   1. Obturator subluxation of hip, left, initial encounter 835.02 S73.022A   2. Other seasonal allergic rhinitis 477.8 J30.2 fluticasone (FLONASE) 50 MCG/ACT nasal spray  3. Insect bites 919.4 T14.8    E906.4 W57.XXXA   4. Headache, unspecified headache type 784.0 R51      Signed, Robyn Haber,  MD

## 2014-09-17 ENCOUNTER — Other Ambulatory Visit: Payer: Self-pay | Admitting: Family Medicine

## 2014-09-20 ENCOUNTER — Other Ambulatory Visit: Payer: Self-pay | Admitting: Family Medicine

## 2014-10-09 ENCOUNTER — Encounter: Payer: Self-pay | Admitting: *Deleted

## 2014-12-27 ENCOUNTER — Other Ambulatory Visit: Payer: Self-pay | Admitting: Family Medicine

## 2015-01-11 ENCOUNTER — Other Ambulatory Visit: Payer: Self-pay | Admitting: Family Medicine

## 2015-01-24 ENCOUNTER — Ambulatory Visit (INDEPENDENT_AMBULATORY_CARE_PROVIDER_SITE_OTHER): Payer: BLUE CROSS/BLUE SHIELD | Admitting: Family Medicine

## 2015-01-24 ENCOUNTER — Encounter: Payer: Self-pay | Admitting: Family Medicine

## 2015-01-24 VITALS — BP 124/70 | HR 87 | Temp 98.0°F | Resp 16 | Ht 65.0 in | Wt 157.0 lb

## 2015-01-24 DIAGNOSIS — R21 Rash and other nonspecific skin eruption: Secondary | ICD-10-CM

## 2015-01-24 DIAGNOSIS — E038 Other specified hypothyroidism: Secondary | ICD-10-CM | POA: Diagnosis not present

## 2015-01-24 LAB — TSH: TSH: 1.54 u[IU]/mL (ref 0.350–4.500)

## 2015-01-24 MED ORDER — LEVOTHYROXINE SODIUM 75 MCG PO TABS: ORAL_TABLET | ORAL | Status: DC

## 2015-01-24 MED ORDER — CEPHALEXIN 500 MG PO CAPS: 500.0000 mg | ORAL_CAPSULE | Freq: Two times a day (BID) | ORAL | Status: DC

## 2015-01-24 NOTE — Patient Instructions (Signed)
Let me know if the rash does not clear. I've refilled her thyroid for year. TSH test should be back in 2-3 days.

## 2015-01-24 NOTE — Progress Notes (Signed)
This chart was scribed for Robyn Haber, MD by Moises Blood, medical scribe at [facility].The patient was seen in exam room 13 and the patient's care was started at 8:43 AM.  Patient ID: Wanda Riley MRN: [id number], DOB: February 08, 1961, 54 y.o. Date of Encounter: 01/24/2015  Primary Physician: Robyn Haber, MD  Chief Complaint:  Chief Complaint  Patient presents with  . Medication Refill    Levothyroxine  . Rash    itching, right side x 2 weeks    HPI:  Wanda Riley is a 54 y.o. female who presents to [facility] complaining of a rash on her right hip that was noticed about 2 weeks ago. The rash feels itchy.   She also needs her thyroid medication refilled.   She's traveling to [place] for 4 days in a month for business trip.   Past Medical History  Diagnosis Date  . Depression   . Thyroid disease      Home Meds: Prior to Admission medications   Medication Sig Start Date End Date Taking? Authorizing Provider  fluticasone (FLONASE) 50 MCG/ACT nasal spray Place 2 sprays into both nostrils daily. 09/11/14   Robyn Haber, MD  levothyroxine (SYNTHROID, LEVOTHROID) 75 MCG tablet TAKE 1 TABLET BY MOUTH DAILY.  "NO MORE REFILLS WITHOUT LABS/OV"" 01/14/15   Chelle Jeffery, PA-C  loratadine (CLARITIN) 10 MG tablet Take 10 mg by mouth daily.    Historical Provider, MD  olopatadine (PATANOL) 0.1 % ophthalmic solution Place 1 drop into the left eye 2 (two) times daily. Patient not taking: Reported on 09/11/2014 01/16/14   Robyn Haber, MD    Allergies: No Known Allergies  Social History   Social History  . Marital Status: Married    Spouse Name: N/A  . Number of Children: N/A  . Years of Education: N/A   Occupational History  . regulatory    Social History Main Topics  . Smoking status: Never Smoker   . Smokeless tobacco: Not on file  . Alcohol Use: 0.6 oz/week    1 Standard drinks or equivalent per week     Comment: glass of  wine 3 x a week  . Drug Use: No  . Sexual Activity: No   Other Topics Concern  . Not on file   Social History Narrative     Review of Systems: Constitutional: negative for chills, fever, night sweats, weight changes, or fatigue  HEENT: negative for vision changes, hearing loss, congestion, rhinorrhea, ST, epistaxis, or sinus pressure Cardiovascular: negative for chest pain or palpitations Respiratory: negative for hemoptysis, wheezing, shortness of breath, or cough Abdominal: negative for abdominal pain, nausea, vomiting, diarrhea, or constipation Dermatological: positive for rash (right hip)  Neurologic: negative for headache, dizziness, or syncope All other systems reviewed and are otherwise negative with the exception to those above and in the HPI.  Physical Exam: Blood pressure 124/70, pulse 87, temperature 98 F (36.7 C), temperature source Oral, resp. rate 16, height 5\' 5"  (1.651 m), weight 157 lb (71.215 kg), SpO2 98 %., Body mass index is 26.13 kg/(m^2). General: Well developed, well nourished, in no acute distress. Head: Normocephalic, atraumatic, eyes without discharge, sclera non-icteric, nares are without discharge. Bilateral auditory canals clear, TM's are without perforation, pearly grey and translucent with reflective cone of light bilaterally. Oral cavity moist, posterior pharynx without exudate, erythema, peritonsillar abscess, or post nasal drip.  Neck: Supple. No thyromegaly. Full ROM. No lymphadenopathy. Lungs: Clear bilaterally to auscultation without wheezes,  rales, or rhonchi. Breathing is unlabored. Heart: RRR with S1 S2. No murmurs, rubs, or gallops appreciated. Abdomen: Soft, non-tender, non-distended with normoactive bowel sounds. No hepatomegaly. No rebound/guarding. No obvious abdominal masses. Msk:  Strength and tone normal for age. Extremities/Skin: Warm and dry. No clubbing or cyanosis. No edema. Patient has a papular excoriated rash on her right hip with  multiple healing scraped papules. Neuro: Alert and oriented X 3. Moves all extremities spontaneously. Gait is normal. CNII-XII grossly in tact. Psych:  Responds to questions appropriately with a normal affect.   Labs:  ASSESSMENT AND PLAN:  54 y.o. year old female with new rash and need for Synthroid refill This chart was scribed in my presence and reviewed by me personally.    ICD-9-CM ICD-10-CM   1. Other specified hypothyroidism 244.8 E03.8 TSH  2. Rash and nonspecific skin eruption 782.1 R21 cephALEXin (KEFLEX) 500 MG capsule     Signed, Robyn Haber, MD 01/24/2015 8:43 AM

## 2015-04-24 IMAGING — CT CT MAXILLOFACIAL W/O CM
2 of 3 series · 15 of 37 positions shown, 18 images · non-contrast
Comparison: None.

CLINICAL DATA: Frontal sinus headache for 3 months, initial
evaluation, personal history of salivary stone removal

EXAM:
CT MAXILLOFACIAL WITHOUT CONTRAST
TECHNIQUE: Multidetector CT imaging of the maxillofacial structures was
performed. Multiplanar CT image reconstructions were also generated.
A small metallic BB was placed on the right temple in order to
reliably differentiate right from left.

[Series 601: coronal facial · coronal · 0.34mm/px · 3 of 82 slices shown]
[im 28/82  bone]
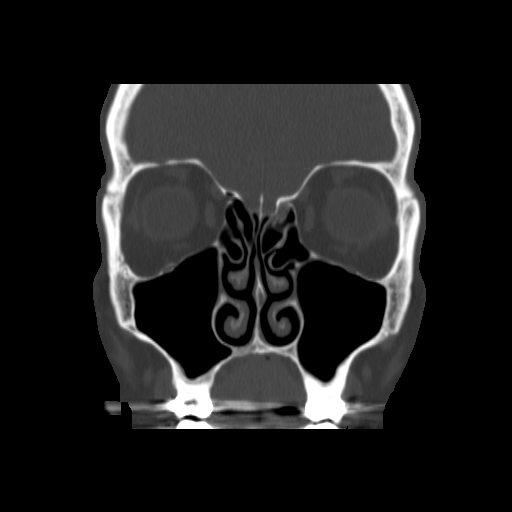
[im 41/82  bone]
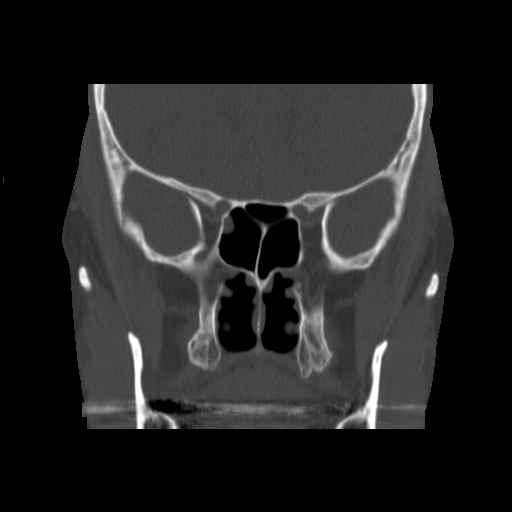
[im 55/82  bone]
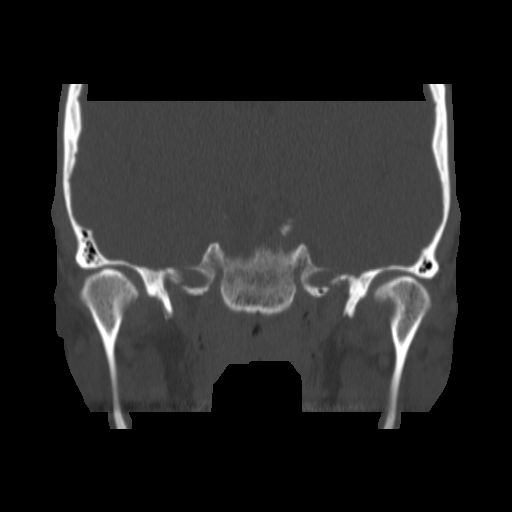

[Series 602: sagittal facial · sagittal · 0.34mm/px · 12 of 81 slices shown, 15 images]
[im 5/81  brain]
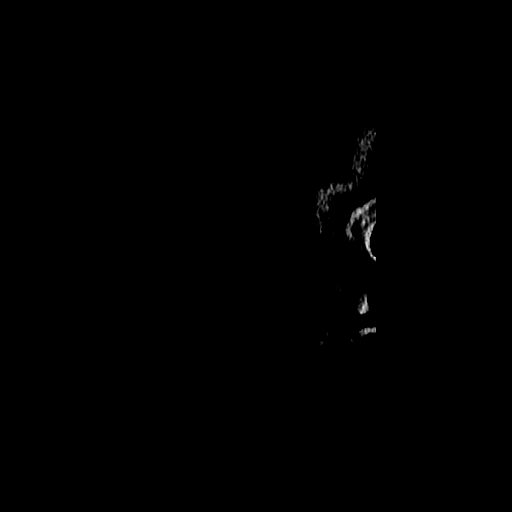
[im 5/81  bone]
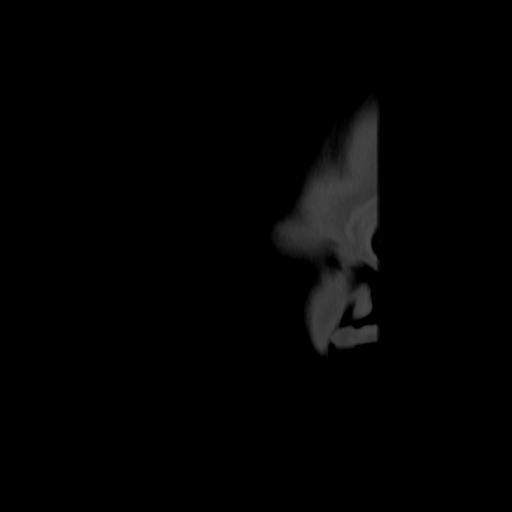
[im 14/81  bone]
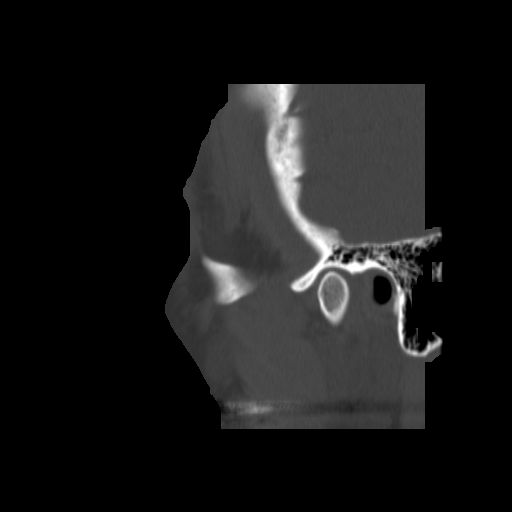
[im 18/81  bone]
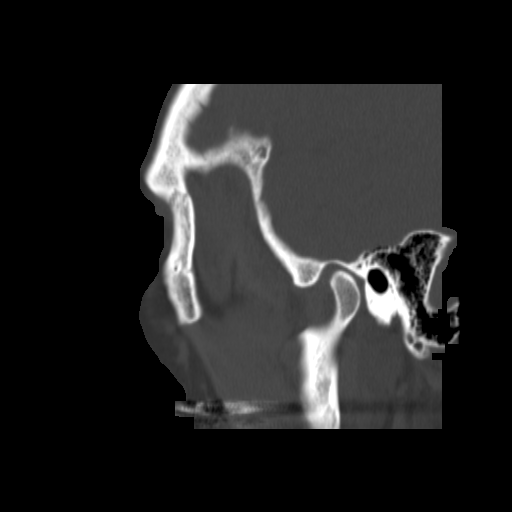
[im 23/81  bone]
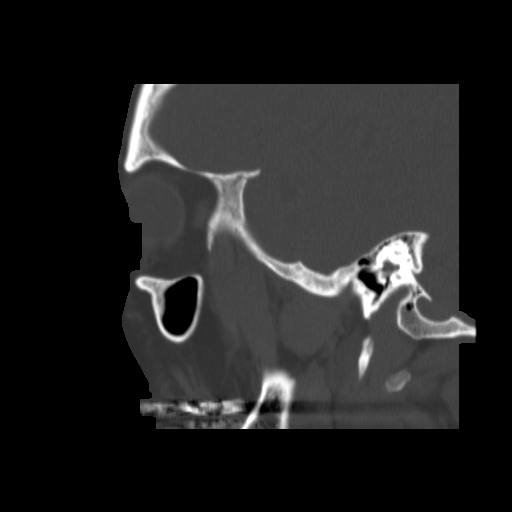
[im 32/81  brain]
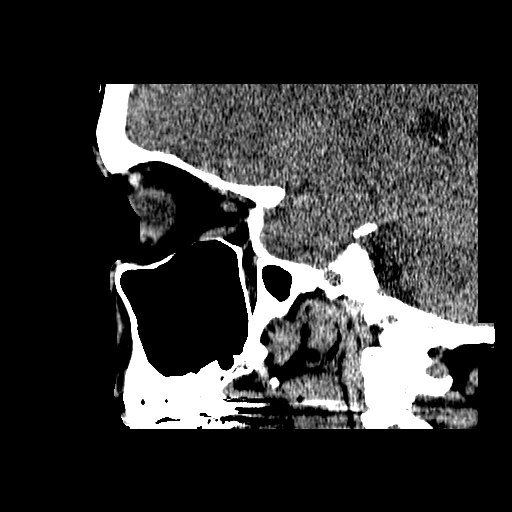
[im 32/81  bone]
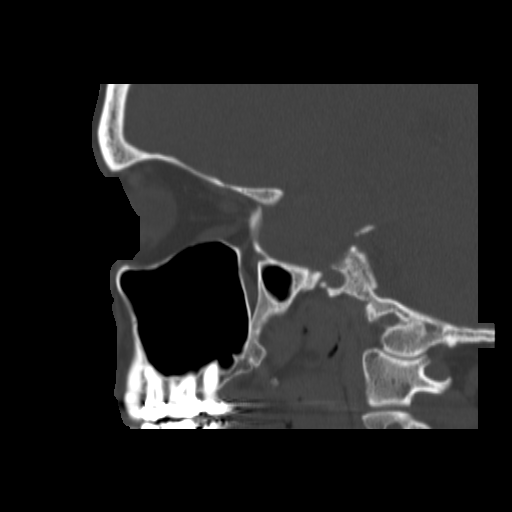
[im 36/81  bone]
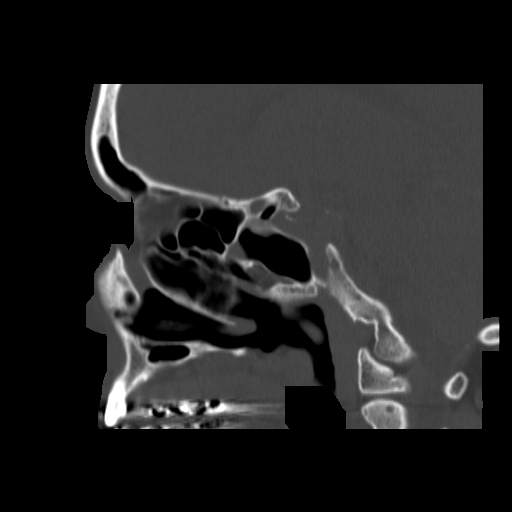
[im 45/81  bone]
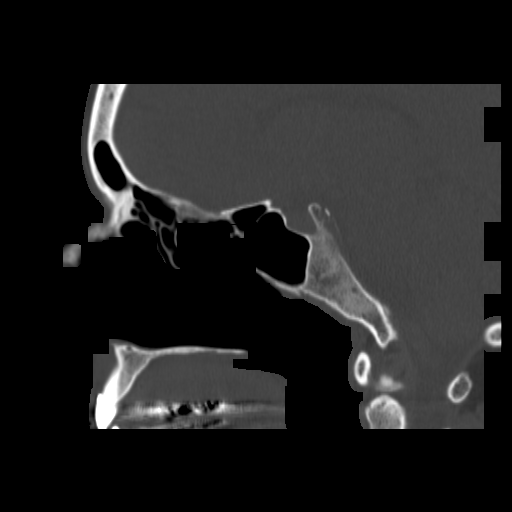
[im 49/81  bone]
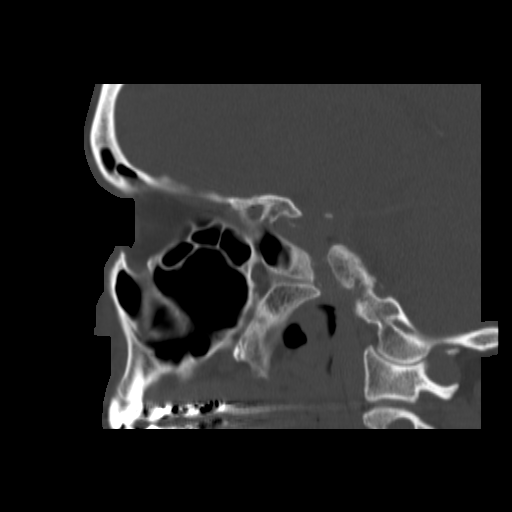
[im 58/81  brain]
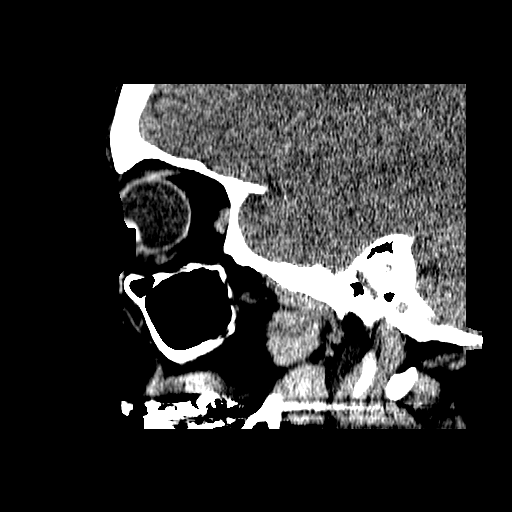
[im 58/81  bone]
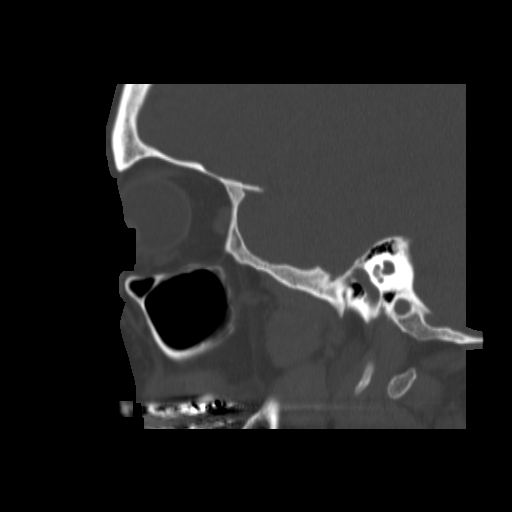
[im 63/81  bone]
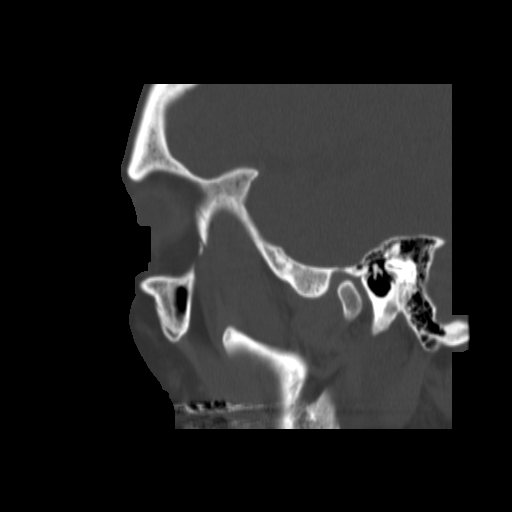
[im 67/81  bone]
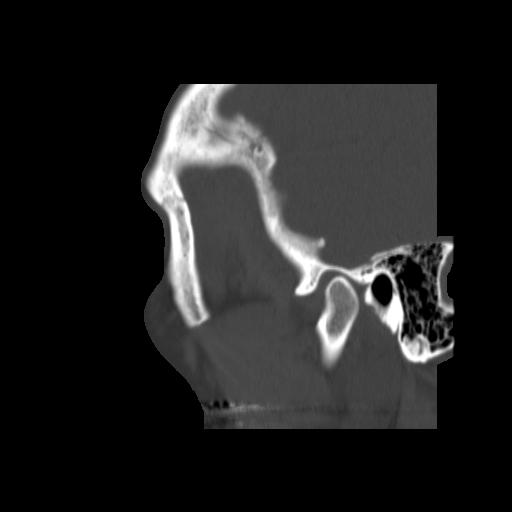
[im 76/81  bone]
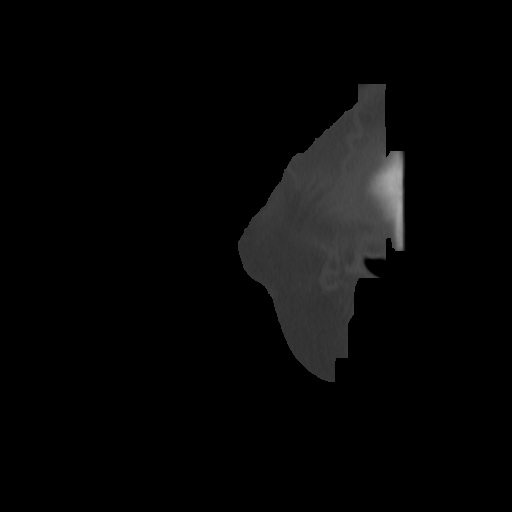

[15 of 37 positions shown; findings below may reference images not displayed]

FINDINGS: Bilateral ostiomeatal complexes are patent. Maxillary sinuses are
clear. There is minimal chronic appearing inflammatory change along
the right lateral wall of the right sphenoid sinus. Frontal sinuses
are clear. The visualized portions of the mastoid air cells are
clear.

There is mild chronic appearing bilateral anterior and posterior
ethmoid air cell inflammation. There is a posterior right 8 mm
opacifiedHaller cell. There are no air-fluid levels in the sinuses.
IMPRESSION: Mild chronic-appearing inflammatory change, sparing the frontal
sinuses.

## 2015-07-26 ENCOUNTER — Ambulatory Visit (INDEPENDENT_AMBULATORY_CARE_PROVIDER_SITE_OTHER): Payer: BLUE CROSS/BLUE SHIELD | Admitting: Family Medicine

## 2015-07-26 VITALS — BP 122/74 | HR 90 | Temp 98.1°F | Resp 17 | Ht 65.0 in | Wt 156.0 lb

## 2015-07-26 DIAGNOSIS — J0100 Acute maxillary sinusitis, unspecified: Secondary | ICD-10-CM | POA: Diagnosis not present

## 2015-07-26 DIAGNOSIS — J209 Acute bronchitis, unspecified: Secondary | ICD-10-CM | POA: Diagnosis not present

## 2015-07-26 MED ORDER — ALBUTEROL SULFATE 108 (90 BASE) MCG/ACT IN AEPB: 2.0000 | INHALATION_SPRAY | Freq: Four times a day (QID) | RESPIRATORY_TRACT | Status: DC | PRN

## 2015-07-26 MED ORDER — AMOXICILLIN-POT CLAVULANATE 875-125 MG PO TABS
1.0000 | ORAL_TABLET | Freq: Two times a day (BID) | ORAL | Status: DC
Start: 2015-07-26 — End: 2015-10-21

## 2015-07-26 NOTE — Patient Instructions (Signed)

## 2015-07-26 NOTE — Progress Notes (Signed)
Patient ID: OLUCHI CANUP MRN: [id number], DOB: 12/26/1960, 55 y.o. Date of Encounter: 07/26/2015, 8:32 AM  Primary Physician: Robyn Haber, MD  Chief Complaint:  Chief Complaint  Patient presents with  . Cough  . URI  . Nasal Congestion    HPI: 55 y.o. year old female presents with a 10 day history of nasal congestion, post nasal drip, sore throat, and cough. Mild sinus pressure. Afebrile. No chills. Nasal congestion thick and green/yellow. Cough is productive of green/yellow sputum and not associated with time of day. Ears feel full, leading to sensation of muffled hearing. Has tried OTC cold preps without success. No GI complaints.   No sick contacts, recent antibiotics, or recent travels.   No leg trauma, sedentary periods, h/o cancer, or tobacco use.  Past Medical History  Diagnosis Date  . Depression   . Thyroid disease      Home Meds: Prior to Admission medications   Medication Sig Start Date End Date Taking? Authorizing Provider  levothyroxine (SYNTHROID, LEVOTHROID) 75 MCG tablet TAKE 1 TABLET BY MOUTH DAILY. 01/24/15  Yes Robyn Haber, MD  Albuterol Sulfate (PROAIR RESPICLICK) 123XX123 (90 Base) MCG/ACT AEPB Inhale 2 puffs into the lungs every 6 (six) hours as needed. 07/26/15   Robyn Haber, MD  amoxicillin-clavulanate (AUGMENTIN) 875-125 MG tablet Take 1 tablet by mouth 2 (two) times daily. 07/26/15   Robyn Haber, MD    Allergies: No Known Allergies  Social History   Social History  . Marital Status: Married    Spouse Name: N/A  . Number of Children: N/A  . Years of Education: N/A   Occupational History  . regulatory    Social History Main Topics  . Smoking status: Never Smoker   . Smokeless tobacco: Not on file  . Alcohol Use: 0.6 oz/week    1 Standard drinks or equivalent per week     Comment: glass of wine 3 x a week  . Drug Use: No  . Sexual Activity: No   Other Topics Concern  . Not on file   Social History Narrative     Review  of Systems:  Constitutional: negative for chills, fever, night sweats or weight changes Cardiovascular: negative for chest pain or palpitations Respiratory: negative for hemoptysis, wheezing, or shortness of breath Abdominal: negative for abdominal pain, nausea, vomiting or diarrhea Dermatological: negative for rash Neurologic: negative for headache   Physical Exam:  Blood pressure 122/74, pulse 90, temperature 98.1 F (36.7 C), temperature source Oral, resp. rate 17, height 5\' 5"  (1.651 m), weight 156 lb (70.761 kg), SpO2 99 %., Body mass index is 25.96 kg/(m^2). General: Well developed, well nourished, in no acute distress. Head: Normocephalic, atraumatic, eyes without discharge, sclera non-icteric, nares are congested. Bilateral auditory canals clear, TM's are without perforation, pearly grey with reflective cone of light bilaterally. No sinus TTP. Oral cavity moist, dentition normal. Posterior pharynx with post nasal drip and mild erythema. No peritonsillar abscess or tonsillar exudate. Neck: Supple. No thyromegaly. Full ROM. No lymphadenopathy. Lungs: Coarse breath sounds bilaterally with rhonchi. Breathing is unlabored.  Heart: RRR with S1 S2. No murmurs, rubs, or gallops appreciated. Msk:  Strength and tone normal for age. Extremities: No clubbing or cyanosis. No edema. Neuro: Alert and oriented X 3. Moves all extremities spontaneously. CNII-XII grossly in tact. Psych:  Responds to questions appropriately with a normal affect.     ASSESSMENT AND PLAN:  55 y.o. year old female with bronchitis. -   ICD-9-CM ICD-10-CM   1.  Acute bronchitis, unspecified organism 466.0 J20.9 amoxicillin-clavulanate (AUGMENTIN) 875-125 MG tablet     Albuterol Sulfate (PROAIR RESPICLICK) 123XX123 (90 Base) MCG/ACT AEPB  2. Acute maxillary sinusitis, recurrence not specified 461.0 J01.00 amoxicillin-clavulanate (AUGMENTIN) 875-125 MG tablet     Albuterol Sulfate (PROAIR RESPICLICK) 123XX123 (90 Base) MCG/ACT  AEPB   -Tylenol/Motrin prn -Rest/fluids -RTC precautions -RTC 3-5 days if no improvement  Signed, Robyn Haber, MD 07/26/2015 8:32 AM

## 2015-10-21 ENCOUNTER — Ambulatory Visit (HOSPITAL_COMMUNITY)
Admission: RE | Admit: 2015-10-21 | Discharge: 2015-10-21 | Disposition: A | Discharge status: Home / Self Care | Payer: BLUE CROSS/BLUE SHIELD | Source: Ambulatory Visit | Attending: Family Medicine | Admitting: Family Medicine

## 2015-10-21 ENCOUNTER — Ambulatory Visit (INDEPENDENT_AMBULATORY_CARE_PROVIDER_SITE_OTHER): Payer: BLUE CROSS/BLUE SHIELD | Admitting: Family Medicine

## 2015-10-21 VITALS — BP 112/76 | HR 86 | Temp 98.3°F | Resp 18 | Ht 65.0 in | Wt 154.6 lb

## 2015-10-21 DIAGNOSIS — E079 Disorder of thyroid, unspecified: Secondary | ICD-10-CM | POA: Insufficient documentation

## 2015-10-21 DIAGNOSIS — M79662 Pain in left lower leg: Secondary | ICD-10-CM

## 2015-10-21 DIAGNOSIS — J0101 Acute recurrent maxillary sinusitis: Secondary | ICD-10-CM | POA: Diagnosis not present

## 2015-10-21 DIAGNOSIS — F329 Major depressive disorder, single episode, unspecified: Secondary | ICD-10-CM | POA: Insufficient documentation

## 2015-10-21 MED ORDER — AMOXICILLIN-POT CLAVULANATE 875-125 MG PO TABS: 1.0000 | ORAL_TABLET | Freq: Two times a day (BID) | ORAL | Status: DC

## 2015-10-21 NOTE — Progress Notes (Signed)
Preliminary results by tech - Venous Duplex Lower Ext. Completed. Negative for deep and superficial vein thrombosis in the left leg. Oda Cogan, BS, RDMS, RVT

## 2015-10-21 NOTE — Patient Instructions (Addendum)
Ultrasound will be ordered to be certain there is no clot in the leg. If you do not hear from the office by late afternoon, 4:00 or so, please call and speak to the team leader and see if they've been able to get that scheduled.  Augmentin (amoxicillin/clavulanate) one pill twice daily for infection  Drink plenty of fluids  Take an over the counter antihistamine decongestant such as Claritin-D or Allegra-D (loratadine or fexofenadine)  Return if worse or not improving  If no clot in the leg, just keep doing gentle exercises and stretches. If there is a clot we will have to treat.    IF you received an x-ray today, you will receive an invoice from [facility]. Please contact [facility] at [phone] with questions or concerns regarding your invoice.   IF you received labwork today, you will receive an invoice from [employer]. Please contact [facility] at [phone] with questions or concerns regarding your invoice.   Our billing staff will not be able to assist you with questions regarding bills from these companies.  You will be contacted with the lab results as soon as they are available. The fastest way to get your results is to activate your My Chart account. Instructions are located on the last page of this paperwork. If you have not heard from [place] regarding the results in 2 weeks, please contact this office.

## 2015-10-21 NOTE — Progress Notes (Signed)
Patient ID: Wanda Riley, female    DOB: 15-Dec-1960  Age: 55 y.o. MRN: [id number]  Chief Complaint  Patient presents with  . Sinusitis    congestion. OTC mucinex is not helping. x3 weeks   . Leg Pain    shooting pain in left leg only in the mornings. x3-4 days    Subjective:   55 year old lady who is here with 2 complaints. She had a respiratory tract infection and sinusitis/bronchitis a little while back this spring. She had done better after being treated with antibiotics. About 3 weeks ago she got a respiratory tract infection and it is just lingering on. She has maxillary sinus region pain in her face. She is congested and coughing. She does not smoke.  She does travel a moderate amount with her job, flying on [place]. She has been having pain for the last few days in the left calf. A couple of weeks ago she had flown to [facility]. She has never had blood clots. Knows of no specific injury. She is not working out regularly currently..  Current allergies, medications, problem list, past/family and social histories reviewed.  Objective:  BP 112/76 mmHg  Pulse 86  Temp(Src) 98.3 F (36.8 C) (Oral)  Resp 18  Ht 5\' 5"  (1.651 m)  Wt 154 lb 9.6 oz (70.126 kg)  BMI 25.73 kg/m2  SpO2 97%  No major acute distress. TMs normal. Tenderness maxillary region. Throat clear. Neck supple without nodes. Chest clear to auscultation. Heart regular without murmur. Left calf is tender in the lower half of the calf, mid calf. No edema. Negative Homans.  Assessment & Plan:   Assessment: 1. Acute recurrent maxillary sinusitis   2. Calf pain, left       Plan: We'll order an ultrasound of the leg. I think it is important especially with her traveling.  Treat the sinusitis with antibiotics again. Return if not improving.  No orders of the defined types were placed in this encounter.    Meds ordered this encounter  Medications  . amoxicillin-clavulanate (AUGMENTIN) 875-125 MG tablet   Sig: Take 1 tablet by mouth 2 (two) times daily.    Dispense:  20 tablet    Refill:  0         Patient Instructions   Ultrasound will be ordered to be certain there is no clot in the leg. If you do not hear from the office by late afternoon, 4:00 or so, please call and speak to the team leader and see if they've been able to get that scheduled.  Augmentin (amoxicillin/clavulanate) one pill twice daily for infection  Drink plenty of fluids  Take an over the counter antihistamine decongestant such as Claritin-D or Allegra-D (loratadine or fexofenadine)  Return if worse or not improving  If no clot in the leg, just keep doing gentle exercises and stretches. If there is a clot we will have to treat.    IF you received an x-ray today, you will receive an invoice from [facility]. Please contact [facility] at [phone] with questions or concerns regarding your invoice.   IF you received labwork today, you will receive an invoice from [employer]. Please contact [facility] at [phone] with questions or concerns regarding your invoice.   Our billing staff will not be able to assist you with questions regarding bills from these companies.  You will be contacted with the lab results as soon as they are available. The fastest way to get your results  is to activate your My Chart account. Instructions are located on the last page of this paperwork. If you have not heard from [place] regarding the results in 2 weeks, please contact this office.          Return if symptoms worsen or fail to improve.   Rodrecus Belsky, MD 10/21/2015

## 2015-10-23 ENCOUNTER — Encounter: Payer: Self-pay | Admitting: *Deleted

## 2015-12-04 DIAGNOSIS — Z713 Dietary counseling and surveillance: Secondary | ICD-10-CM | POA: Diagnosis not present

## 2016-01-13 ENCOUNTER — Other Ambulatory Visit: Payer: Self-pay | Admitting: Family Medicine

## 2016-01-20 ENCOUNTER — Ambulatory Visit (INDEPENDENT_AMBULATORY_CARE_PROVIDER_SITE_OTHER): Payer: BLUE CROSS/BLUE SHIELD | Admitting: Family Medicine

## 2016-01-20 VITALS — BP 104/66 | HR 70 | Temp 97.8°F | Resp 18 | Ht 65.0 in | Wt 168.0 lb

## 2016-01-20 DIAGNOSIS — R3129 Other microscopic hematuria: Secondary | ICD-10-CM | POA: Diagnosis not present

## 2016-01-20 DIAGNOSIS — E039 Hypothyroidism, unspecified: Secondary | ICD-10-CM | POA: Diagnosis not present

## 2016-01-20 DIAGNOSIS — R300 Dysuria: Secondary | ICD-10-CM

## 2016-01-20 DIAGNOSIS — R1030 Lower abdominal pain, unspecified: Secondary | ICD-10-CM | POA: Diagnosis not present

## 2016-01-20 LAB — TSH: TSH: 1.94 m[IU]/L

## 2016-01-20 LAB — COMPREHENSIVE METABOLIC PANEL
ALBUMIN: 4.7 g/dL (ref 3.6–5.1)
ALK PHOS: 70 U/L (ref 33–130)
ALT: 12 U/L (ref 6–29)
AST: 15 U/L (ref 10–35)
BUN: 10 mg/dL (ref 7–25)
CALCIUM: 9.4 mg/dL (ref 8.6–10.4)
CO2: 25 mmol/L (ref 20–31)
Chloride: 102 mmol/L (ref 98–110)
Creat: 0.58 mg/dL (ref 0.50–1.05)
Glucose, Bld: 101 mg/dL — ABNORMAL HIGH (ref 65–99)
POTASSIUM: 4.1 mmol/L (ref 3.5–5.3)
Sodium: 137 mmol/L (ref 135–146)
TOTAL PROTEIN: 7.1 g/dL (ref 6.1–8.1)
Total Bilirubin: 0.3 mg/dL (ref 0.2–1.2)

## 2016-01-20 LAB — POCT CBC
Granulocyte percent: 56.3 %G (ref 37–80)
HCT, POC: 39.5 % (ref 37.7–47.9)
HEMOGLOBIN: 14.2 g/dL (ref 12.2–16.2)
LYMPH, POC: 2 (ref 0.6–3.4)
MCH: 30.8 pg (ref 27–31.2)
MCHC: 35.9 g/dL — AB (ref 31.8–35.4)
MCV: 85.8 fL (ref 80–97)
MID (CBC): 0.4 (ref 0–0.9)
MPV: 7.3 fL (ref 0–99.8)
POC GRANULOCYTE: 3.1 (ref 2–6.9)
POC LYMPH %: 35.7 % (ref 10–50)
POC MID %: 8 % (ref 0–12)
Platelet Count, POC: 278 10*3/uL (ref 142–424)
RBC: 4.6 M/uL (ref 4.04–5.48)
RDW, POC: 13.1 %
WBC: 5.5 10*3/uL (ref 4.6–10.2)

## 2016-01-20 LAB — POCT URINALYSIS DIP (MANUAL ENTRY)
BILIRUBIN UA: NEGATIVE
Bilirubin, UA: NEGATIVE
GLUCOSE UA: NEGATIVE
LEUKOCYTES UA: NEGATIVE
Nitrite, UA: NEGATIVE
Protein Ur, POC: NEGATIVE
Urobilinogen, UA: 0.2
pH, UA: 7

## 2016-01-20 LAB — POC MICROSCOPIC URINALYSIS (UMFC): Mucus: ABSENT

## 2016-01-20 NOTE — Progress Notes (Signed)
Subjective:  By signing my name below, I, Wanda Riley, attest that this documentation has been prepared under the direction and in the presence of Wanda Cheadle, MD. Electronically Signed: Moises Riley, [place]. 01/20/2016 , 3:16 PM .  Patient was seen in Room 8 .   Patient ID: Wanda Riley, female    DOB: January 04, 1961, 55 y.o.   MRN: [id number] Chief Complaint  Patient presents with  . Dysuria  . Diarrhea   HPI Wanda Riley is a 55 y.o. female who presents to [facility] complaining of discolored urine (rust colored) with an odor and diarrhea noticed a week ago. Patient states she traveled to [place], [place] and thought she ate some bad food. She notes still having some abdominal discomfort and bloating with watery diarrhea. She's been trying to drink more water. She also informs sweats for the past 2 weeks. She denies nocturia past 1~2 times a night, flank pain, fever, chills, back pain, appetite loss, or Riley in the stool. She denies recent antibiotics.   She hasn't had her thyroid checked in a year.   Past Medical History:  Diagnosis Date  . Depression   . Thyroid disease    Prior to Admission medications   Medication Sig Start Date End Date Taking? Authorizing Provider  levothyroxine (SYNTHROID, LEVOTHROID) 75 MCG tablet TAKE 1 TABLET BY MOUTH EVERY DAY 01/14/16  Yes Robyn Haber, MD  Albuterol Sulfate (PROAIR RESPICLICK) [id number] (90 Base) MCG/ACT AEPB Inhale 2 puffs into the lungs every 6 (six) hours as needed. Patient not taking: Reported on 01/20/2016 07/26/15   Robyn Haber, MD   No Known Allergies   Review of Systems  Constitutional: Negative for appetite change, chills, fatigue and fever.  Gastrointestinal: Positive for abdominal distention, abdominal pain and diarrhea. Negative for Riley in stool.  Genitourinary: Positive for dysuria. Negative for flank pain, frequency, vaginal bleeding and vaginal discharge.  Musculoskeletal: Negative for back pain.       Objective:   Physical Exam  Constitutional: She is oriented to person, place, and time. She appears well-developed and well-nourished. No distress.  HENT:  Head: Normocephalic and atraumatic.  Eyes: EOM are normal. Pupils are equal, round, and reactive to light.  Neck: Neck supple.  Cardiovascular: Normal rate and regular rhythm.   Murmur heard.  Systolic (right upper sternum border) murmur is present with a grade of 2/6  Pulmonary/Chest: Effort normal and breath sounds normal. No respiratory distress.  Abdominal: There is no CVA tenderness.  Musculoskeletal: Normal range of motion.  Neurological: She is alert and oriented to person, place, and time.  Skin: Skin is warm and dry.  Psychiatric: She has a normal mood and affect. Her behavior is normal.  Nursing note and vitals reviewed.   BP 104/66 (BP Location: Right Arm, Patient Position: Sitting, Cuff Size: Small)   Pulse 70   Temp 97.8 F (36.6 C) (Oral)   Resp 18   Ht 5\' 5"  (1.651 m)   Wt 168 lb (76.2 kg)   SpO2 98%   BMI 27.96 kg/m     Assessment & Plan:   1. Dysuria   2. Hypothyroidism, unspecified hypothyroidism type   3. Lower abdominal pain   4. Microscopic hematuria     Orders Placed This Encounter  Procedures  . Urine culture  . Comprehensive metabolic panel  . TSH  . POCT Microscopic Urinalysis ([facility])  . POCT urinalysis dipstick  . POCT CBC  . POCT Microscopic Urinalysis ([facility])    Standing Status:  Future    Standing Expiration Date:   03/21/2016  . POCT urinalysis dipstick    Standing Status:   Future    Standing Expiration Date:   03/21/2016    Meds ordered this encounter  Medications  . levothyroxine (SYNTHROID, LEVOTHROID) 75 MCG tablet    Sig: Take 1 tablet (75 mcg total) by mouth daily.    Dispense:  90 tablet    Refill:  3    I personally performed the services described in this documentation, which was scribed in my presence. The recorded information has been reviewed and considered, and addended by  me as needed.   Wanda Riley, M.D.  [facility] [address] [place], [place] [address] [phone] phone [phone] fax  02/02/16 10:26 PM  Results for orders placed or performed in visit on 01/20/16  Urine culture  Result Value Ref Range   Colony Count 1,000-10,000 CFU/mL    Organism ID, Bacteria      Multiple organisms present,each less than 10,000 CFU/mL. These organisms,commonly found on external and internal genitalia,are considered colonizers. No further testing performed.   Comprehensive metabolic panel  Result Value Ref Range   Sodium 137 135 - 146 mmol/L   Potassium 4.1 3.5 - 5.3 mmol/L   Chloride 102 98 - 110 mmol/L   CO2 25 20 - 31 mmol/L   Glucose, Bld 101 (H) 65 - 99 mg/dL   BUN 10 7 - 25 mg/dL   Creat 0.58 0.50 - 1.05 mg/dL   Total Bilirubin 0.3 0.2 - 1.2 mg/dL   Alkaline Phosphatase 70 33 - 130 U/L   AST 15 10 - 35 U/L   ALT 12 6 - 29 U/L   Total Protein 7.1 6.1 - 8.1 g/dL   Albumin 4.7 3.6 - 5.1 g/dL   Calcium 9.4 8.6 - 10.4 mg/dL  TSH  Result Value Ref Range   TSH 1.94 mIU/L  POCT Microscopic Urinalysis ([facility])  Result Value Ref Range   WBC,UR,HPF,POC None None WBC/hpf   RBC,UR,HPF,POC None None RBC/hpf   Bacteria None None, Too numerous to count   Mucus Absent Absent   Epithelial Cells, UR Per Microscopy None None, Too numerous to count cells/hpf  POCT urinalysis dipstick  Result Value Ref Range   Color, UA yellow yellow   Clarity, UA clear clear   Glucose, UA negative negative   Bilirubin, UA negative negative   Ketones, POC UA negative negative   Spec Grav, UA <=1.005    Riley, UA small (A) negative   pH, UA 7.0    Protein Ur, POC negative negative   Urobilinogen, UA 0.2    Nitrite, UA Negative Negative   Leukocytes, UA Negative Negative  POCT CBC  Result Value Ref Range   WBC 5.5 4.6 - 10.2 K/uL   Lymph, poc 2.0 0.6 - 3.4   POC LYMPH PERCENT 35.7 10 - 50 %L   MID (cbc) 0.4 0 - 0.9   POC MID % 8.0  0 - 12 %M   POC Granulocyte 3.1 2 - 6.9   Granulocyte percent 56.3 37 - 80 %G   RBC 4.60 4.04 - 5.48 M/uL   Hemoglobin 14.2 12.2 - 16.2 g/dL   HCT, POC 39.5 37.7 - 47.9 %   MCV 85.8 80 - 97 fL   MCH, POC 30.8 27 - 31.2 pg   MCHC 35.9 (A) 31.8 - 35.4 g/dL   RDW, POC 13.1 %   Platelet Count,  POC 278 142 - 424 K/uL   MPV 7.3 0 - 99.8 fL

## 2016-01-20 NOTE — Patient Instructions (Addendum)
IF you received an x-ray today, you will receive an invoice from [facility]. Please contact [facility] at [phone] with questions or concerns regarding your invoice.   IF you received labwork today, you will receive an invoice from [employer]. Please contact [facility] at [phone] with questions or concerns regarding your invoice.   Our billing staff will not be able to assist you with questions regarding bills from these companies.  You will be contacted with the lab results as soon as they are available. The fastest way to get your results is to activate your My Chart account. Instructions are located on the last page of this paperwork. If you have not heard from [place] regarding the results in 2 weeks, please contact this office.    We recommend that you schedule a mammogram for breast cancer screening. Typically, you do not need a referral to do this. Please contact a local imaging center to schedule your mammogram.  [facility] - [phone]  *ask for the Radiology Department [facility] - [phone] or [phone]  [facility] - [phone] [place] [phone] [facility]  - [phone]  *ask for the [facility] - [phone]  *ask for the Radiology Department [facility] - [phone]  *ask for the [place] - [phone]   Hematuria, Adult Hematuria is blood in your urine. It can be caused by a bladder infection, kidney infection, prostate infection, kidney stone, or cancer of your urinary tract. Infections can usually be treated with medicine, and a kidney stone usually will pass through your urine. If neither of these is the cause of your hematuria, further workup to find out the reason may be needed. It is very important that you tell your health care  provider about any blood you see in your urine, even if the blood stops without treatment or happens without causing pain. Blood in your urine that happens and then stops and then happens again can be a symptom of a very serious condition. Also, pain is not a symptom in the initial stages of many urinary cancers. HOME CARE INSTRUCTIONS   Drink lots of fluid, 3-4 quarts a day. If you have been diagnosed with an infection, cranberry juice is especially recommended, in addition to large amounts of water.  Avoid caffeine, tea, and carbonated beverages because they tend to irritate the bladder.  Avoid alcohol because it may irritate the prostate.  Take all medicines as directed by your health care provider.  If you were prescribed an antibiotic medicine, finish it all even if you start to feel better.  If you have been diagnosed with a kidney stone, follow your health care provider's instructions regarding straining your urine to catch the stone.  Empty your bladder often. Avoid holding urine for long periods of time.  After a bowel movement, women should cleanse front to back. Use each tissue only once.  Empty your bladder before and after sexual intercourse if you are a female. SEEK MEDICAL CARE IF:  You develop back pain.  You have a fever.  You have a feeling of sickness in your stomach (nausea) or vomiting.  Your symptoms are not better in 3 days. Return sooner if you are getting worse. SEEK IMMEDIATE MEDICAL CARE IF:   You develop severe vomiting and are unable to keep the medicine down.  You develop severe back or  abdominal pain despite taking your medicines.  You begin passing a large amount of blood or clots in your urine.  You feel extremely weak or faint, or you pass out. MAKE SURE YOU:   Understand these instructions.  Will watch your condition.  Will get help right away if you are not doing well or get worse.   This information is not intended to replace advice  given to you by your health care provider. Make sure you discuss any questions you have with your health care provider.   Document Released: 04/20/2005 Document Revised: 05/11/2014 Document Reviewed: 12/19/2012 Elsevier Interactive Patient Education [employer].

## 2016-01-21 LAB — URINE CULTURE

## 2016-01-27 MED ORDER — LEVOTHYROXINE SODIUM 75 MCG PO TABS: 75.0000 ug | ORAL_TABLET | Freq: Every day | ORAL | 3 refills | Status: DC

## 2016-10-15 ENCOUNTER — Ambulatory Visit (INDEPENDENT_AMBULATORY_CARE_PROVIDER_SITE_OTHER): Payer: BLUE CROSS/BLUE SHIELD | Admitting: Family Medicine

## 2016-10-15 ENCOUNTER — Encounter: Payer: Self-pay | Admitting: Family Medicine

## 2016-10-15 VITALS — BP 108/62 | HR 82 | Temp 98.5°F | Resp 18 | Ht 64.96 in | Wt 153.0 lb

## 2016-10-15 DIAGNOSIS — Z711 Person with feared health complaint in whom no diagnosis is made: Secondary | ICD-10-CM | POA: Diagnosis not present

## 2016-10-15 DIAGNOSIS — W57XXXA Bitten or stung by nonvenomous insect and other nonvenomous arthropods, initial encounter: Secondary | ICD-10-CM

## 2016-10-15 MED ORDER — DOXYCYCLINE HYCLATE 100 MG PO TABS: 100.0000 mg | ORAL_TABLET | Freq: Two times a day (BID) | ORAL | 0 refills | Status: DC

## 2016-10-15 NOTE — Progress Notes (Signed)
   Wanda Riley is a 56 y.o. female who presents to Primary Care at [facility] today for concern for finding a tick on her:  1.  Tick:  Patient was getting dressed this morning when she noticed a tick crawling on her left knee. It was not attached. She has moved into a new house with lots of words in her backyard about 2 weeks ago. She's been spending time outside. She did shower last night and did not notice any insects or ticks attached to her that point.  She was able to pick up the tick embedded in a plastic bag. She brought that in today. She's had no rash. No fevers or chills. No aches. No flulike symptoms. She has a little bit anxious about a pack she found a tick on her.  ROS as above.    PMH reviewed. Patient is a nonsmoker.   Past Medical History:  Diagnosis Date  . Depression   . Thyroid disease    Past Surgical History:  Procedure Laterality Date  . OTHER SURGICAL HISTORY     stone in salivary gland removed    Medications reviewed. Current Outpatient Prescriptions  Medication Sig Dispense Refill  . levothyroxine (SYNTHROID, LEVOTHROID) 75 MCG tablet Take 1 tablet (75 mcg total) by mouth daily. 90 tablet 3   No current facility-administered medications for this visit.      Physical Exam:  BP 108/62   Pulse 82   Temp 98.5 F (36.9 C) (Oral)   Resp 18   Ht 5' 4.96" (1.65 m)   Wt 153 lb (69.4 kg)   SpO2 98%   BMI 25.49 kg/m  Gen:  Alert, cooperative patient who appears stated age in no acute distress.  Vital signs reviewed. HEENT: EOMI,  MMM Skin:  No rash/lesions.  No evidence of tick attachment at Left knee where she found the tick.  Assessment and Plan:  1.  Tick exposure: - without any actual attachment/no actual bite - Tick is non-engorged and still alive in baggie.  Does not appear to have fed - Discussed various treatment options.  As the tick was not attached I do not think she warrants treatment with antibiotics. -However she would rather be safe than  sorry. I have sent in a 10 day course of doxycycline for her to pick up if she begins noticing rash, flulike symptoms, arthralgias. -To call if she has any further questions.

## 2016-10-15 NOTE — Patient Instructions (Addendum)
  Because you just found the tick crawling on you and not attached, you should be good to go.  If at any point you notice a rash, odd fevers and chills like the flu, or other symptoms like this, you can pick up the doxycycline.  You would take this twice daily for the next 2 weeks.  Let us know if you have any questions or concerns.     IF you received an x-ray today, you will receive an invoice from [facility]. Please contact [facility] at [phone] with questions or concerns regarding your invoice.   IF you received labwork today, you will receive an invoice from [facility]. Please contact [facility] at [phone] with questions or concerns regarding your invoice.   Our billing staff will not be able to assist you with questions regarding bills from these companies.  You will be contacted with the lab results as soon as they are available. The fastest way to get your results is to activate your My Chart account. Instructions are located on the last page of this paperwork. If you have not heard from [place] regarding the results in 2 weeks, please contact this office.

## 2017-03-14 ENCOUNTER — Other Ambulatory Visit: Payer: Self-pay | Admitting: Family Medicine

## 2017-03-15 NOTE — Telephone Encounter (Signed)
Pt. Needs to schedule an appointment.

## 2017-04-14 ENCOUNTER — Other Ambulatory Visit: Payer: Self-pay | Admitting: Family Medicine

## 2017-04-29 ENCOUNTER — Other Ambulatory Visit: Payer: Self-pay | Admitting: Family Medicine

## 2017-05-06 ENCOUNTER — Other Ambulatory Visit: Payer: Self-pay | Admitting: Family Medicine

## 2017-05-09 ENCOUNTER — Other Ambulatory Visit: Payer: Self-pay | Admitting: Family Medicine

## 2017-05-20 ENCOUNTER — Ambulatory Visit: Payer: BLUE CROSS/BLUE SHIELD | Admitting: Physician Assistant

## 2017-05-20 ENCOUNTER — Encounter: Payer: Self-pay | Admitting: Physician Assistant

## 2017-05-20 VITALS — BP 116/62 | HR 64 | Temp 97.4°F | Resp 16 | Ht 64.9 in | Wt 144.0 lb

## 2017-05-20 DIAGNOSIS — E039 Hypothyroidism, unspecified: Secondary | ICD-10-CM | POA: Diagnosis not present

## 2017-05-20 MED ORDER — LEVOTHYROXINE SODIUM 75 MCG PO TABS: 75.0000 ug | ORAL_TABLET | Freq: Every day | ORAL | 3 refills | Status: DC

## 2017-05-20 NOTE — Patient Instructions (Addendum)
I will contact you with the lab results.   Within the next 6 months, you should consider getting a physical exam (annual physical exam).  I see this has not been performed yet.      IF you received an x-ray today, you will receive an invoice from [facility]. Please contact [facility] at [phone] with questions or concerns regarding your invoice.   IF you received labwork today, you will receive an invoice from [facility]. Please contact [facility] at [phone] with questions or concerns regarding your invoice.   Our billing staff will not be able to assist you with questions regarding bills from these companies.  You will be contacted with the lab results as soon as they are available. The fastest way to get your results is to activate your My Chart account. Instructions are located on the last page of this paperwork. If you have not heard from [place] regarding the results in 2 weeks, please contact this office.

## 2017-05-20 NOTE — Progress Notes (Signed)
PRIMARY CARE AT [place], [place] [address] [phone]  Date:  05/20/2017   Name:  Wanda Riley   DOB:  05-15-1960   MRN:  [id number]  PCP:  Patient, No Pcp Per    History of Present Illness:  Wanda Riley is a 57 y.o. female patient who presents to PCP with  Chief Complaint  Patient presents with  . Medication Refill    Levothyroxine     Patient is here for refill of her levothyroxine.  She has been taking the same medication at the same dosage for the last several years, therapeutically.  No chest pains, sob, palpitaitons, skin changes, diarrhea, constipation, diarrhea. She has lost 9lbs over the last 6 months intentionally due to starting running for 1.5 hours 3x per day.  She has also improved her diet.  She states she fees good.  Patient Active Problem List   Diagnosis Date Noted  . Hypercholesteremia 06/15/2012  . Hypothyroid 05/28/2011  . Depression 05/28/2011  . STRESS FRACTURE OF FEMORAL NECK 12/24/2008  . 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 HIP PAIN, LEFT 01/03/2008  . METATARSALGIA 01/03/2008    Past Medical History:  Diagnosis Date  . Depression   . Thyroid disease     Past Surgical History:  Procedure Laterality Date  . OTHER SURGICAL HISTORY     stone in salivary gland removed    Social History   Tobacco Use  . Smoking status: Never Smoker  . Smokeless tobacco: Never Used  Substance Use Topics  . Alcohol use: Yes    Alcohol/week: 0.6 oz    Types: 1 Standard drinks or equivalent per week    Comment: glass of wine 3 x a week  . Drug use: No    Family History  Problem Relation Age of Onset  . Cancer Brother   . Hypertension Mother   . Heart disease Father   . Hyperlipidemia Father     No Known Allergies  Medication list has been reviewed and updated.  Current Outpatient Medications on File Prior to Visit  Medication Sig Dispense Refill  . levothyroxine (SYNTHROID, LEVOTHROID) 75 MCG tablet TAKE 1 TABLET BY MOUTH DAILY  30 tablet 0   No current facility-administered medications on file prior to visit.     ROS ROS otherwise unremarkable unless listed above.  Physical Examination: BP 116/62   Pulse 64   Temp (!) 97.4 F (36.3 C) (Oral)   Resp 16   Ht 5' 4.9" (1.648 m)   Wt 144 lb (65.3 kg)   SpO2 99%   BMI 24.04 kg/m  Ideal Body Weight: Weight in (lb) to have BMI = 25: 149.5  Physical Exam  Constitutional: She is oriented to person, place, and time. She appears well-developed and well-nourished. No distress.  HENT:  Head: Normocephalic and atraumatic.  Right Ear: External ear normal.  Left Ear: External ear normal.  Eyes: Conjunctivae and EOM are normal. Pupils are equal, round, and reactive to light.  Neck: No thyroid mass and no thyromegaly present.  Cardiovascular: Normal rate.  Pulmonary/Chest: Effort normal. No respiratory distress.  Neurological: She is alert and oriented to person, place, and time.  Skin: She is not diaphoretic.  Psychiatric: She has a normal mood and affect. Her behavior is normal.     Assessment and Plan: Wanda Riley is a 57 y.o. female who is here today for cc of  Chief Complaint  Patient presents with  . Medication Refill  Levothyroxine   Hypothyroidism, unspecified type - Plan: TSH, levothyroxine (SYNTHROID, LEVOTHROID) 75 MCG tablet  Ivar Drape, PA-C [facility] 1/17/20198:35 AM

## 2017-05-21 LAB — TSH: TSH: 2.04 u[IU]/mL (ref 0.450–4.500)

## 2017-06-02 ENCOUNTER — Other Ambulatory Visit: Payer: Self-pay

## 2017-06-02 ENCOUNTER — Encounter: Payer: Self-pay | Admitting: Emergency Medicine

## 2017-06-02 ENCOUNTER — Ambulatory Visit (INDEPENDENT_AMBULATORY_CARE_PROVIDER_SITE_OTHER): Payer: BLUE CROSS/BLUE SHIELD | Admitting: Emergency Medicine

## 2017-06-02 VITALS — BP 120/72 | HR 74 | Temp 97.8°F | Resp 16 | Ht 65.0 in | Wt 147.2 lb

## 2017-06-02 DIAGNOSIS — R059 Cough, unspecified: Secondary | ICD-10-CM | POA: Insufficient documentation

## 2017-06-02 DIAGNOSIS — R0981 Nasal congestion: Secondary | ICD-10-CM | POA: Diagnosis not present

## 2017-06-02 DIAGNOSIS — J0100 Acute maxillary sinusitis, unspecified: Secondary | ICD-10-CM

## 2017-06-02 DIAGNOSIS — R05 Cough: Secondary | ICD-10-CM | POA: Diagnosis not present

## 2017-06-02 MED ORDER — TRIAMCINOLONE ACETONIDE 55 MCG/ACT NA AERO: 2.0000 | INHALATION_SPRAY | Freq: Every day | NASAL | 12 refills | Status: AC

## 2017-06-02 MED ORDER — AZITHROMYCIN 250 MG PO TABS: ORAL_TABLET | ORAL | 0 refills | Status: DC

## 2017-06-02 MED ORDER — PSEUDOEPHEDRINE-GUAIFENESIN ER 60-600 MG PO TB12: 1.0000 | ORAL_TABLET | Freq: Two times a day (BID) | ORAL | 1 refills | Status: AC

## 2017-06-02 NOTE — Patient Instructions (Addendum)
     IF you received an x-ray today, you will receive an invoice from [facility]. Please contact [facility] at [phone] with questions or concerns regarding your invoice.   IF you received labwork today, you will receive an invoice from [facility]. Please contact [facility] at [phone] with questions or concerns regarding your invoice.   Our billing staff will not be able to assist you with questions regarding bills from these companies.  You will be contacted with the lab results as soon as they are available. The fastest way to get your results is to activate your My Chart account. Instructions are located on the last page of this paperwork. If you have not heard from us regarding the results in 2 weeks, please contact this office.     Sinusitis, Adult Sinusitis is soreness and inflammation of your sinuses. Sinuses are hollow spaces in the bones around your face. They are located:  Around your eyes.  In the middle of your forehead.  Behind your nose.  In your cheekbones.  Your sinuses and nasal passages are lined with a stringy fluid (mucus). Mucus normally drains out of your sinuses. When your nasal tissues get inflamed or swollen, the mucus can get trapped or blocked so air cannot flow through your sinuses. This lets bacteria, viruses, and funguses grow, and that leads to infection. Follow these instructions at home: Medicines  Take, use, or apply over-the-counter and prescription medicines only as told by your doctor. These may include nasal sprays.  If you were prescribed an antibiotic medicine, take it as told by your doctor. Do not stop taking the antibiotic even if you start to feel better. Hydrate and Humidify  Drink enough water to keep your pee (urine) clear or pale yellow.  Use a cool mist humidifier to keep the humidity level in your home above 50%.  Breathe in steam for 10-15 minutes, 3-4 times a day or as told by your doctor. You can do  this in the bathroom while a hot shower is running.  Try not to spend time in cool or dry air. Rest  Rest as much as possible.  Sleep with your head raised (elevated).  Make sure to get enough sleep each night. General instructions  Put a warm, moist washcloth on your face 3-4 times a day or as told by your doctor. This will help with discomfort.  Wash your hands often with soap and water. If there is no soap and water, use hand sanitizer.  Do not smoke. Avoid being around people who are smoking (secondhand smoke).  Keep all follow-up visits as told by your doctor. This is important. Contact a doctor if:  You have a fever.  Your symptoms get worse.  Your symptoms do not get better within 10 days. Get help right away if:  You have a very bad headache.  You cannot stop throwing up (vomiting).  You have pain or swelling around your face or eyes.  You have trouble seeing.  You feel confused.  Your neck is stiff.  You have trouble breathing. This information is not intended to replace advice given to you by your health care provider. Make sure you discuss any questions you have with your health care provider. Document Released: 10/07/2007 Document Revised: 12/15/2015 Document Reviewed: 02/13/2015 Elsevier Interactive Patient Education  2018 Else[employer] Inc[employer]  

## 2017-06-02 NOTE — Progress Notes (Signed)
Wanda Riley 57 y.o.   Chief Complaint  Patient presents with  . Cough    NONPRODUCTIVE X 5 DAYS  . Sinus Problem    facial pain    HISTORY OF PRESENT ILLNESS: This is a 57 y.o. female complaining of sinus problems for 5 days.  Mostly complaining of facial pain and pressure along with a nonproductive cough.  Denies fever, chills, nausea, vomiting, or any other significant symptomatology.   Sinus Problem  This is a new problem. The current episode started in the past 7 days. The problem has been gradually worsening since onset. There has been no fever. Her pain is at a severity of 6/10. The pain is moderate. Associated symptoms include congestion, coughing, headaches and sinus pressure. Pertinent negatives include no chills, ear pain, neck pain, shortness of breath, sore throat or swollen glands. Treatments tried: Mucinex.     Prior to Admission medications   Medication Sig Start Date End Date Taking? Authorizing Provider  levothyroxine (SYNTHROID, LEVOTHROID) 75 MCG tablet Take 1 tablet (75 mcg total) by mouth daily. 05/20/17  Yes Ivar Drape D, PA    No Known Allergies  Patient Active Problem List   Diagnosis Date Noted  . Hypercholesteremia 06/15/2012  . Hypothyroid 05/28/2011  . Depression 05/28/2011  . STRESS FRACTURE OF FEMORAL NECK 12/24/2008  . 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 HIP PAIN, LEFT 01/03/2008  . METATARSALGIA 01/03/2008    Past Medical History:  Diagnosis Date  . Depression   . Thyroid disease     Past Surgical History:  Procedure Laterality Date  . OTHER SURGICAL HISTORY     stone in salivary gland removed    Social History   Socioeconomic History  . Marital status: Divorced    Spouse name: Not on file  . Number of children: Not on file  . Years of education: Not on file  . Highest education level: Not on file  Social Needs  . Financial resource strain: Not on file  . Food insecurity - worry: Not on file  . Food insecurity -  inability: Not on file  . Transportation needs - medical: Not on file  . Transportation needs - non-medical: Not on file  Occupational History  . Occupation: regulatory  Tobacco Use  . Smoking status: Never Smoker  . Smokeless tobacco: Never Used  Substance and Sexual Activity  . Alcohol use: Yes    Alcohol/week: 0.6 oz    Types: 1 Standard drinks or equivalent per week    Comment: glass of wine 3 x a week  . Drug use: No  . Sexual activity: No  Other Topics Concern  . Not on file  Social History Narrative  . Not on file    Family History  Problem Relation Age of Onset  . Cancer Brother   . Hypertension Mother   . Heart disease Father   . Hyperlipidemia Father      Review of Systems  Constitutional: Negative for chills, fever and weight loss.  HENT: Positive for congestion and sinus pressure. Negative for ear pain and sore throat.   Eyes: Negative for discharge and redness.  Respiratory: Positive for cough. Negative for shortness of breath.   Cardiovascular: Negative for chest pain and palpitations.  Gastrointestinal: Negative for abdominal pain, diarrhea, nausea and vomiting.  Genitourinary: Negative.   Musculoskeletal: Negative for neck pain.  Skin: Negative for rash.  Neurological: Positive for headaches.  Endo/Heme/Allergies: Negative.   All other systems reviewed and are negative.  Vitals:   06/02/17 0846  BP: 120/72  Pulse: 74  Resp: 16  Temp: 97.8 F (36.6 C)  SpO2: 99%    Physical Exam  Constitutional: She is oriented to person, place, and time. She appears well-developed and well-nourished.  HENT:  Head: Normocephalic and atraumatic.  Right Ear: Hearing, tympanic membrane, external ear and ear canal normal.  Left Ear: Hearing, tympanic membrane, external ear and ear canal normal.  Nose: Right sinus exhibits maxillary sinus tenderness. Left sinus exhibits maxillary sinus tenderness.  Mouth/Throat: Oropharynx is clear and moist.  Eyes:  Conjunctivae and EOM are normal. Pupils are equal, round, and reactive to light.  Neck: Normal range of motion. Neck supple. No JVD present.  Cardiovascular: Normal rate, regular rhythm and normal heart sounds.  Pulmonary/Chest: Effort normal and breath sounds normal.  Abdominal: Soft. Bowel sounds are normal. She exhibits no distension. There is no tenderness.  Musculoskeletal: Normal range of motion.  Lymphadenopathy:    She has no cervical adenopathy.  Neurological: She is alert and oriented to person, place, and time. No sensory deficit. She exhibits normal muscle tone.  Skin: Skin is warm and dry. Capillary refill takes less than 2 seconds.  Psychiatric: She has a normal mood and affect. Her behavior is normal.  Vitals reviewed.    ASSESSMENT & PLAN: Zahria was seen today for cough and sinus problem.  Diagnoses and all orders for this visit:  Sinus congestion -     pseudoephedrine-guaifenesin (MUCINEX D) 60-600 MG 12 hr tablet; Take 1 tablet by mouth every 12 (twelve) hours for 5 days. -     triamcinolone (NASACORT) 55 MCG/ACT AERO nasal inhaler; Place 2 sprays into the nose daily.  Cough  Acute non-recurrent maxillary sinusitis -     azithromycin (ZITHROMAX) 250 MG tablet; Sig as indicated    Patient Instructions       IF you received an x-ray today, you will receive an invoice from [facility]. Please contact [facility] at [phone] with questions or concerns regarding your invoice.   IF you received labwork today, you will receive an invoice from [facility]. Please contact [facility] at [phone] with questions or concerns regarding your invoice.   Our billing staff will not be able to assist you with questions regarding bills from these companies.  You will be contacted with the lab results as soon as they are available. The fastest way to get your results is to activate your My Chart account. Instructions are located on the last page of this  paperwork. If you have not heard from [place] regarding the results in 2 weeks, please contact this office.     Sinusitis, Adult Sinusitis is soreness and inflammation of your sinuses. Sinuses are hollow spaces in the bones around your face. They are located:  Around your eyes.  In the middle of your forehead.  Behind your nose.  In your cheekbones.  Your sinuses and nasal passages are lined with a stringy fluid (mucus). Mucus normally drains out of your sinuses. When your nasal tissues get inflamed or swollen, the mucus can get trapped or blocked so air cannot flow through your sinuses. This lets bacteria, viruses, and funguses grow, and that leads to infection. Follow these instructions at home: Medicines  Take, use, or apply over-the-counter and prescription medicines only as told by your doctor. These may include nasal sprays.  If you were prescribed an antibiotic medicine, take it as told by your doctor. Do not stop taking the antibiotic even if  you start to feel better. Hydrate and Humidify  Drink enough water to keep your pee (urine) clear or pale yellow.  Use a cool mist humidifier to keep the humidity level in your home above 50%.  Breathe in steam for 10-15 minutes, 3-4 times a day or as told by your doctor. You can do this in the bathroom while a hot shower is running.  Try not to spend time in cool or dry air. Rest  Rest as much as possible.  Sleep with your head raised (elevated).  Make sure to get enough sleep each night. General instructions  Put a warm, moist washcloth on your face 3-4 times a day or as told by your doctor. This will help with discomfort.  Wash your hands often with soap and water. If there is no soap and water, use hand sanitizer.  Do not smoke. Avoid being around people who are smoking (secondhand smoke).  Keep all follow-up visits as told by your doctor. This is important. Contact a doctor if:  You have a fever.  Your symptoms get  worse.  Your symptoms do not get better within 10 days. Get help right away if:  You have a very bad headache.  You cannot stop throwing up (vomiting).  You have pain or swelling around your face or eyes.  You have trouble seeing.  You feel confused.  Your neck is stiff.  You have trouble breathing. This information is not intended to replace advice given to you by your health care provider. Make sure you discuss any questions you have with your health care provider. Document Released: 10/07/2007 Document Revised: 12/15/2015 Document Reviewed: 02/13/2015 Elsevier Interactive Patient Education  2018 Elsevier Inc.      Agustina Caroli, MD [facility]

## 2017-06-08 DIAGNOSIS — Z13220 Encounter for screening for lipoid disorders: Secondary | ICD-10-CM | POA: Diagnosis not present

## 2017-06-08 DIAGNOSIS — Z6823 Body mass index (BMI) 23.0-23.9, adult: Secondary | ICD-10-CM | POA: Diagnosis not present

## 2017-06-08 DIAGNOSIS — Z1151 Encounter for screening for human papillomavirus (HPV): Secondary | ICD-10-CM | POA: Diagnosis not present

## 2017-06-08 DIAGNOSIS — Z0000 Encounter for general adult medical examination without abnormal findings: Secondary | ICD-10-CM | POA: Diagnosis not present

## 2017-06-08 DIAGNOSIS — Z01419 Encounter for gynecological examination (general) (routine) without abnormal findings: Secondary | ICD-10-CM | POA: Diagnosis not present

## 2017-06-08 DIAGNOSIS — Z130 Encounter for screening for diseases of the blood and blood-forming organs and certain disorders involving the immune mechanism: Secondary | ICD-10-CM | POA: Diagnosis not present

## 2017-06-08 DIAGNOSIS — Z1329 Encounter for screening for other suspected endocrine disorder: Secondary | ICD-10-CM | POA: Diagnosis not present

## 2017-06-08 DIAGNOSIS — Z131 Encounter for screening for diabetes mellitus: Secondary | ICD-10-CM | POA: Diagnosis not present

## 2017-06-08 DIAGNOSIS — Z1231 Encounter for screening mammogram for malignant neoplasm of breast: Secondary | ICD-10-CM | POA: Diagnosis not present

## 2017-07-28 DIAGNOSIS — Z0100 Encounter for examination of eyes and vision without abnormal findings: Secondary | ICD-10-CM | POA: Diagnosis not present

## 2017-08-11 ENCOUNTER — Encounter: Payer: Self-pay | Admitting: Physician Assistant

## 2017-08-30 DIAGNOSIS — H2513 Age-related nuclear cataract, bilateral: Secondary | ICD-10-CM | POA: Diagnosis not present

## 2017-10-05 ENCOUNTER — Encounter: Payer: Self-pay | Admitting: Sports Medicine

## 2017-10-05 ENCOUNTER — Ambulatory Visit: Payer: BLUE CROSS/BLUE SHIELD | Admitting: Sports Medicine

## 2017-10-05 VITALS — BP 102/68 | Ht 65.0 in | Wt 140.0 lb

## 2017-10-05 DIAGNOSIS — M76891 Other specified enthesopathies of right lower limb, excluding foot: Secondary | ICD-10-CM | POA: Diagnosis not present

## 2017-10-05 MED ORDER — NITROGLYCERIN 0.2 MG/HR TD PT24: MEDICATED_PATCH | TRANSDERMAL | 1 refills | Status: DC

## 2017-10-05 NOTE — Patient Instructions (Signed)

## 2017-10-05 NOTE — Progress Notes (Signed)
Chief complaint: Right glute and leg pain x 3 weeks  History of present illness: Wanda Riley is a 57 year old female presents to sports medicine office today with chief complaint of right gluteal leg pain for the last 3 weeks.  She specifically reports that date that symptoms started was on May 11.  She reports that she was running 11 miles on the day that she first noticed symptoms.  She reports that evening after running, started noticing pain in her right glutes and right posterior leg.  She reports noticing pain with prolonged sitting, prolonged standing, walking, as well as running up and down hills.  She reports that symptoms have been the same over the last few weeks.  She does not report of any low back pain, numbness, tingling, burning paresthesias.  She has not reported any pain in her right leg.  She does not report of any hip or knee pain.  She reports that she has tried over-the-counter ibuprofen, with no interval improvement in symptoms.  She reports rest otherwise is alleviating factor.   Review of systems:  As stated above  Her past medical history, surgical history, family history, and social history obtained and reviewed.  Her past medical history is notable for hypothyroidism; surgical history is notable for salivary gland stone removal; she has not reported any current tobacco use; family history notable for hypertension, CAD, hyperlipidemia, cancer; allergies and medications are reviewed and are reflected in EMR.  Physical exam: Vital signs are reviewed and are documented in the chart Gen.: Alert, oriented, appears stated age, in no apparent distress HEENT: Moist oral mucosa Respiratory: Normal respirations, able to speak in full sentences Cardiac: Regular rate, distal pulses 2+ Integumentary: No rashes on visible skin:  Neurologic: She does have slight weakness noted with hamstring strength on the left side, would categorize strength is 4+/5, this is specifically isolating the  biceps femoris, she does have intact strength with hip flexion, hip abduction, knee flexion, knee extension, as well as quadriceps strength, sensation 2+ in bilateral lower extremities Psych: Normal affect, mood is described as good Musculoskeletal: Inspection of her hamstring region reveals no obvious deformity or muscle atrophy, no palpable masses or defects noted, no warmth, erythema, ecchymosis, or effusion, she is specifically tender to palpation along the proximal medial hamstrings at its attachment at the ischial tuberosity, no tenderness to palpation in the mid substance of the hamstring, no tenderness over the piriformis, gluteal medius, or glute minimus, she does have great hip flexion, internal, and external range of motion, straight leg, FABER, FADIR, and logroll negative, she does not walk with an antalgic gait  Limited musculoskeletal ultrasound was performed in the office today of her right hamstring - At the proximal right hamstring, do see hypoechoic changes noted as well as the area calcification about a centimeter distal from the ischial tuberosity, no evidence of neovascularization seen  Impression:  right proximal hamstring tendinopathy  Ultrasound performed and interpreted by: Mort Sawyers, MD and Dyke Brackett MD  Assessment and plan: 1.  Right proximal hamstring calcific tendinopathy 2.  History of hypothyroidism  Plan: Discussed with [name] today that her symptoms are consistent with proximal hamstring calcific tendinopathy.  Discussed we will have her started on nitroglycerin protocol, to use a quarter patch daily applied to affected area.  Discussed rotate side of placement on daily basis to minimize risk of rash.  Discussed to notify us if she starts to get any headaches.  No contraindications to include migraines or rosacea.  We  will also provide her with hamstring compressive sleeve to wear.  She will also be started on Askling exercises.  To top things off, she  will be provided with a small heel lift on both sides to take pressure off the hamstring.  She will follow-up in about 6 weeks for reevaluation or sooner as needed.   Mort Sawyers, M.D. [facility]  I observed and examined the patient with the [facility] Fellow and agree with assessment and plan.  Note reviewed and modified by me. Stefanie Libel, MD

## 2017-10-15 ENCOUNTER — Ambulatory Visit: Payer: Self-pay

## 2017-10-15 NOTE — Telephone Encounter (Signed)
Phone call from pt.  Reported onset of rash on back (@ waistline), that started approx. 10/06/17, and has spread along posterior waistline, and bilateral flank.  Stated the rash appears in 2" x 3" red patches with palpable raised bumps.  Stated the rash itches moderately to severely.  Also reported she had redness swelling and itching of left eye on Monday, 6/10.  Reported this has improved since she applied warm compress to the area.  C/o sinus pressure in her facial/cheek area.  Reported she is using Nitroglycerin patches for hamstring tendonitis, and has had headache in conjunction with using the NTG.  Denied any swelling of lips/mouth or breathing difficulty.  Questioned if her rash was poison ivy, or a reaction to the NTG.  Denied any local reaction to the NTG patch.  Reported using Ivirest, topically to rash. And [employer].  Also, reported using Epsom Salts in bath for her hamstring.  Advised that the Epsom Salts may be drying and irritating to rash area.  Appt. given for 6/15 @ 1:40 PM.  Care advice given per protocol.  Verb. Understanding.  Agrees with plan.  Agrees to seek emergency care if symptoms worsen.                 Reason for Disposition . SEVERE itching (i.e., interferes with sleep, normal activities or school)  Answer Assessment - Initial Assessment Questions 1. APPEARANCE of RASH: "Describe the rash." (e.g., spots, blisters, raised areas, skin peeling, scaly)     Patches of red areas with raised bumps 2. SIZE: "How big are the spots?" (e.g., tip of pen, eraser, coin; inches, centimeters)     2" x 3" areas 3. LOCATION: "Where is the rash located?"     Waist line across back and flank area, bilateral 4. COLOR: "What color is the rash?" (Note: It is difficult to assess rash color in people with darker-colored skin. When this situation occurs, simply ask the caller to describe what they see.)     red 5. ONSET: "When did the rash begin?"     About June 5th 6. FEVER: "Do you have  a fever?" If so, ask: "What is your temperature, how was it measured, and when did it start?"     No  7. ITCHING: "Does the rash itch?" If so, ask: "How bad is the itch?" (Scale 1-10; or mild, moderate, severe)     Moderate - severe 8. CAUSE: "What do you think is causing the rash?"    Unsure if this is related to Nitroglycerin patches 9. MEDICATION FACTORS: "Have you started any new medications within the last 2 weeks?" (e.g., antibiotics)      Nitroglycerine patch 10. OTHER SYMPTOMS: "Do you have any other symptoms?" (e.g., dizziness, headache, sore throat, joint pain)      C/o headache intermittently since NTG patches; increased sinus Pressure. Some redness, swelling, itching of left eye.  Protocols used: RASH OR REDNESS - [facility]

## 2017-10-16 ENCOUNTER — Other Ambulatory Visit: Payer: Self-pay

## 2017-10-16 ENCOUNTER — Ambulatory Visit: Payer: BLUE CROSS/BLUE SHIELD | Admitting: Physician Assistant

## 2017-10-16 ENCOUNTER — Encounter: Payer: Self-pay | Admitting: Physician Assistant

## 2017-10-16 VITALS — BP 102/68 | HR 83 | Temp 98.7°F | Resp 16 | Ht 64.96 in | Wt 141.0 lb

## 2017-10-16 DIAGNOSIS — L239 Allergic contact dermatitis, unspecified cause: Secondary | ICD-10-CM

## 2017-10-16 DIAGNOSIS — R519 Headache, unspecified: Secondary | ICD-10-CM

## 2017-10-16 DIAGNOSIS — R51 Headache: Secondary | ICD-10-CM | POA: Diagnosis not present

## 2017-10-16 MED ORDER — TRIAMCINOLONE ACETONIDE 0.1 % EX CREA: 1.0000 "application " | TOPICAL_CREAM | Freq: Two times a day (BID) | CUTANEOUS | 0 refills | Status: DC

## 2017-10-16 NOTE — Patient Instructions (Signed)
     IF you received an x-ray today, you will receive an invoice from [facility]. Please contact [facility] at [phone] with questions or concerns regarding your invoice.   IF you received labwork today, you will receive an invoice from [facility]. Please contact [facility] at [phone] with questions or concerns regarding your invoice.   Our billing staff will not be able to assist you with questions regarding bills from these companies.  You will be contacted with the lab results as soon as they are available. The fastest way to get your results is to activate your My Chart account. Instructions are located on the last page of this paperwork. If you have not heard from us regarding the results in 2 weeks, please contact this office.     

## 2017-10-16 NOTE — Progress Notes (Signed)
    10/16/2017 2:12 PM   DOB: November 20, 1960 / MRN: [id number]  SUBJECTIVE:  Wanda Riley is a 57 y.o. female presenting for   She has No Known Allergies.   She  has a past medical history of Depression and Thyroid disease.    She  reports that she has never smoked. She has never used smokeless tobacco. She reports that she drinks about 0.6 oz of alcohol per week. She reports that she does not use drugs. She  reports that she does not engage in sexual activity. The patient  has a past surgical history that includes Other surgical history.  Her family history includes Cancer in her brother; Heart disease in her father; Hyperlipidemia in her father; Hypertension in her mother.  ROS  The problem list and medications were reviewed and updated by myself where necessary and exist elsewhere in the encounter.   OBJECTIVE:  BP 102/68   Pulse 83   Temp 98.7 F (37.1 C) (Oral)   Resp 16   Ht 5' 4.96" (1.65 m)   Wt 141 lb (64 kg)   SpO2 97%   BMI 23.49 kg/m   Physical Exam  No results found for this or any previous visit (from the past 72 hour(s)).  No results found.  ASSESSMENT AND PLAN:  There are no diagnoses linked to this encounter.  The patient is advised to call or return to clinic if she does not see an improvement in symptoms, or to seek the care of the closest emergency department if she worsens with the above plan.   Philis Fendt, MHS, PA-C [facility] 10/16/2017 2:12 PM

## 2017-10-16 NOTE — Progress Notes (Signed)
10/16/2017 2:17 PM   DOB: 02/07/1961 / MRN: [id number]  SUBJECTIVE:  Wanda Riley is a 57 y.o. female presenting for pruritic rash about the bilateral posterior lower back.  Patient denies any joint pain, photophobia.  Tells me is intensely itchy.  Did have a nitroglycerin patch which is new for her however this was placed at a lower area and the rash.  She does complain of facial pressure and pain.  Has been prescribed triamcinolone intranasal in the past.  No fever, teeth pain, eye pain.  Symptoms present for about a week.  She has No Known Allergies.   She  has a past medical history of Depression and Thyroid disease.    She  reports that she has never smoked. She has never used smokeless tobacco. She reports that she drinks about 0.6 oz of alcohol per week. She reports that she does not use drugs. She  reports that she does not engage in sexual activity. The patient  has a past surgical history that includes Other surgical history.  Her family history includes Cancer in her brother; Heart disease in her father; Hyperlipidemia in her father; Hypertension in her mother.  Review of Systems  Constitutional: Negative for chills, diaphoresis and fever.  Eyes: Negative.   Respiratory: Negative for shortness of breath.   Cardiovascular: Negative for chest pain, orthopnea and leg swelling.  Gastrointestinal: Negative for abdominal pain, blood in stool, constipation, diarrhea, heartburn, melena, nausea and vomiting.  Genitourinary: Negative for flank pain.  Skin: Positive for itching and rash.  Neurological: Negative for dizziness, sensory change, speech change, focal weakness and headaches.    The problem list and medications were reviewed and updated by myself where necessary and exist elsewhere in the encounter.   OBJECTIVE:  BP 102/68   Pulse 83   Temp 98.7 F (37.1 C) (Oral)   Resp 16   Ht 5' 4.96" (1.65 m)   Wt 141 lb (64 kg)   SpO2 97%   BMI 23.49 kg/m   Physical Exam    Constitutional: She is oriented to person, place, and time. She appears well-nourished. No distress.  HENT:  Right Ear: Hearing, tympanic membrane, external ear and ear canal normal.  Left Ear: Hearing, tympanic membrane, external ear and ear canal normal.  Nose: Nose normal. Right sinus exhibits no maxillary sinus tenderness and no frontal sinus tenderness. Left sinus exhibits no maxillary sinus tenderness and no frontal sinus tenderness.  Mouth/Throat: Uvula is midline, oropharynx is clear and moist and mucous membranes are normal. Mucous membranes are not dry. No oropharyngeal exudate, posterior oropharyngeal edema or tonsillar abscesses.  Eyes: Pupils are equal, round, and reactive to light. EOM are normal.  Cardiovascular: Normal rate.  Pulmonary/Chest: Effort normal.  Abdominal: She exhibits no distension.  Lymphadenopathy:       Head (right side): No submandibular and no tonsillar adenopathy present.       Head (left side): No submandibular and no tonsillar adenopathy present.    She has no cervical adenopathy.  Neurological: She is alert and oriented to person, place, and time. No cranial nerve deficit. Gait normal.  Skin: Skin is dry. She is not diaphoretic.  Psychiatric: She has a normal mood and affect.  Vitals reviewed.            No results found for this or any previous visit (from the past 72 hour(s)).  No results found.  ASSESSMENT AND PLAN:  Wanda Riley was seen today for rash.  Diagnoses  and all orders for this visit:  Allergic dermatitis Comments: No red flags.  Will treat symptomatically.  Facial pain Comments: If not improved with triamcinolone call and I can start you on azithromycin or Z-Pak.  Other orders -     triamcinolone cream (KENALOG) 0.1 %; Apply 1 application topically 2 (two) times daily.    The patient is advised to call or return to clinic if she does not see an improvement in symptoms, or to seek the care of the closest emergency  department if she worsens with the above plan.   Philis Fendt, MHS, PA-C [facility] 10/16/2017 2:17 PM

## 2017-10-18 ENCOUNTER — Encounter: Payer: Self-pay | Admitting: Sports Medicine

## 2017-11-16 ENCOUNTER — Ambulatory Visit: Payer: BLUE CROSS/BLUE SHIELD | Admitting: Sports Medicine

## 2017-11-16 ENCOUNTER — Encounter: Payer: Self-pay | Admitting: Sports Medicine

## 2017-11-16 DIAGNOSIS — S76311D Strain of muscle, fascia and tendon of the posterior muscle group at thigh level, right thigh, subsequent encounter: Secondary | ICD-10-CM

## 2017-11-16 DIAGNOSIS — S76319A Strain of muscle, fascia and tendon of the posterior muscle group at thigh level, unspecified thigh, initial encounter: Secondary | ICD-10-CM | POA: Insufficient documentation

## 2017-11-16 NOTE — Assessment & Plan Note (Signed)
Much improved Keep up HEP program Add dynamic drills at end of runs Use HS sleeve NTG  Reck and repeat HS scan in 6 wks

## 2017-11-16 NOTE — Progress Notes (Signed)
CC: RT HS pain  Since visit 6 weeks ago about 80% improved NTG seems to have helped HEP continuing Has eased back into running and now up to 25 MPW Long run of 9 miles did not make this worse No sciatic sxs Using heel lifts for cushion and has 'old custom orthotics from [place]  ROS No sciatica No weakness in leg Some limping in first 800 M of a run  PE Pleasnat F in NAD BP 98/68   Ht 5\' 5"  (1.651 m)   Wt 139 lb (63 kg)   BMI 23.13 kg/m   HS strength testing is strong today but causes pain on RT Hip exam is normal Strength of hip mm is normal Gait shows minimal limp w running Able to hop 5 times Able to overstride with no pain

## 2017-11-16 NOTE — Patient Instructions (Signed)
Keep up home exercises  Keep up NTG patches  Add running drills as follows  1. 10 x 50 yards of overstriding 2. 10 x hop 3. Run backwards 10 x 50 yards  [employer] train - bike is good  Curls, flys, presses, running arm swing - 3 lb weight  Return in 6 weeks and we will repeat US scan

## 2017-11-25 ENCOUNTER — Other Ambulatory Visit: Payer: Self-pay

## 2017-11-25 MED ORDER — NITROGLYCERIN 0.2 MG/HR TD PT24: MEDICATED_PATCH | TRANSDERMAL | 0 refills | Status: DC

## 2017-12-05 ENCOUNTER — Encounter: Payer: Self-pay | Admitting: Sports Medicine

## 2017-12-21 ENCOUNTER — Encounter: Payer: Self-pay | Admitting: Sports Medicine

## 2017-12-21 ENCOUNTER — Ambulatory Visit: Payer: BLUE CROSS/BLUE SHIELD | Admitting: Sports Medicine

## 2017-12-21 VITALS — BP 120/70 | Ht 65.5 in | Wt 139.0 lb

## 2017-12-21 DIAGNOSIS — S76311D Strain of muscle, fascia and tendon of the posterior muscle group at thigh level, right thigh, subsequent encounter: Secondary | ICD-10-CM | POA: Diagnosis not present

## 2017-12-21 DIAGNOSIS — M7742 Metatarsalgia, left foot: Secondary | ICD-10-CM

## 2017-12-21 NOTE — Assessment & Plan Note (Addendum)
Much improved  Would cont. HEP/ NTG and can use compression on long runs  Marathon training and high risk of reinjury  US shows resolution of hypoechoic change

## 2017-12-21 NOTE — Progress Notes (Signed)
   HPI  CC: Left foot pain  Wanda Riley is a 57 year old female who presents with left foot pain.  She states this pain started around 2 to 3 months ago, and is present when she starts running around 5 to 7 miles.  She runs around 40 miles a week.  She states the pain is between her second third toe.  States it is really only brought on when she is running.  She states rest does help the pain.  She tried a neuroma pad, without any effect.  She states she believes she might a place that pad incorrectly in her shoe.  She states she is tried some stretches, with minimal relief.  She denies any numbness and tingling.  She denies any weakness to the foot.  She does not recall any trauma to the area.  Hamstring injury - no SXS now/ ON askling exercise/ NTG/ compression sleeve Able to run 13 miles with no real pain  See HPI and/or previous note for associated ROS. No sicatica No weakness  Objective: BP 120/70   Ht 5' 5.5" (1.664 m)   Wt 139 lb (63 kg)   BMI 22.78 kg/m  Gen: NAD, well groomed, a/o x3, normal affect.  CV: Well-perfused. Warm.  Resp: Non-labored.  Neuro: Sensation intact throughout. No  gross coordination deficits.  Gait: Nonpathologic posture, unremarkable stride without signs of limp or balance issues.   Left foot exam: No erythema, swelling, warmth noted.  Bossing of the metatarsals noted.  Tenderness to palpation between the second and third distal MT, worse on the plantar aspect.  Full range of motion through dorsiflexion, plantarflexion, eversion, inversion.  Strength 5 out of 5 throughout all testing.  Negative anterior drawer.  Ultrasound of left foot: - Scan of dorsum of foot between first and second metatarsal showed some hyperechoic changes around the nerve with some calcification present around the nerve.   Assessment and plan: Metatarsalgia, left foot  We treated her today with a metatarsal pad on her left foot.  We will try her in this correction for the next 6 weeks.   If she has any issues with worsening pain she increases her running distance over the next couple months, she should call for reassessment.  We will see her back in 6 weeks for reassessment.  Consider adjusting her in a pair of orthotics at that time she has no improvement.  [name], MD [facility] 12/21/2017 4:45 PM  I observed and examined the patient with the [facility] and agree with assessment and plan.  Note reviewed and modified by me. Ila Mcgill, MD

## 2018-03-07 ENCOUNTER — Ambulatory Visit: Payer: BLUE CROSS/BLUE SHIELD | Admitting: Physician Assistant

## 2018-03-07 ENCOUNTER — Other Ambulatory Visit: Payer: Self-pay

## 2018-03-07 ENCOUNTER — Encounter: Payer: Self-pay | Admitting: Physician Assistant

## 2018-03-07 VITALS — BP 125/72 | HR 61 | Temp 98.0°F | Resp 20 | Ht 64.72 in | Wt 138.2 lb

## 2018-03-07 DIAGNOSIS — Z23 Encounter for immunization: Secondary | ICD-10-CM | POA: Diagnosis not present

## 2018-03-07 DIAGNOSIS — E039 Hypothyroidism, unspecified: Secondary | ICD-10-CM

## 2018-03-07 MED ORDER — ZOSTER VAC RECOMB ADJUVANTED 50 MCG/0.5ML IM SUSR: 0.5000 mL | Freq: Once | INTRAMUSCULAR | 0 refills | Status: AC

## 2018-03-07 MED ORDER — LEVOTHYROXINE SODIUM 75 MCG PO TABS: 75.0000 ug | ORAL_TABLET | Freq: Every day | ORAL | 3 refills | Status: DC

## 2018-03-07 NOTE — Progress Notes (Signed)
   Wanda Riley  MRN: [id number] DOB: 1960/11/06  Subjective:  Wanda Riley is a 57 y.o. female seen in office today for a chief complaint of medication refill for Synthroid and to discuss shingles vaccine.  Hypothyroidism: Dx 1996. Controlled on synthroid 32mcg.  Has been on this dose for as long as she can remember.  Tolerating medication well.  Denies heart palpitations, chest pain, dry skin, hair thinning, weight change, nausea, vomiting, diarrhea, constipation, and irritability.  Patient had chickenpox as a child.  Has never had shingles vaccine.  Has never had shingles outbreak.  Denies smoking and illicit drug use.  No other questions or concerns today. Review of Systems  Per HPI  Patient Active Problem List   Diagnosis Date Noted  . Hamstring muscle strain 11/16/2017  . Sinus congestion 06/02/2017  . Cough 06/02/2017  . Acute non-recurrent maxillary sinusitis 06/02/2017  . Hypercholesteremia 06/15/2012  . Hypothyroid 05/28/2011  . Depression 05/28/2011  . STRESS FRACTURE OF FEMORAL NECK 12/24/2008  . 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 HIP PAIN, LEFT 01/03/2008  . METATARSALGIA 01/03/2008    Current Outpatient Medications on File Prior to Visit  Medication Sig Dispense Refill  . levothyroxine (SYNTHROID, LEVOTHROID) 75 MCG tablet Take 1 tablet (75 mcg total) by mouth daily. 90 tablet 3  . triamcinolone (NASACORT) 55 MCG/ACT AERO nasal inhaler Place 2 sprays into the nose daily. 1 Inhaler 12  . nitroGLYCERIN (NITRODUR - DOSED IN MG/24 HR) 0.2 mg/hr patch Place 1/4 patch to the affected area daily. 30 patch 0  . triamcinolone cream (KENALOG) 0.1 % Apply 1 application topically 2 (two) times daily. (Patient not taking: Reported on 11/16/2017) 30 g 0   No current facility-administered medications on file prior to visit.     No Known Allergies   Objective:  BP 125/72   Pulse 61   Temp 98 F (36.7 C) (Oral)   Resp 20   Ht 5' 4.72" (1.644 m)   Wt 138 lb 3.2 oz (62.7  kg)   SpO2 99%   BMI 23.19 kg/m   Physical Exam  Constitutional: She is oriented to person, place, and time. She appears well-developed and well-nourished. No distress.  HENT:  Head: Normocephalic and atraumatic.  Eyes: Conjunctivae are normal.  Neck: Normal range of motion. No thyroid mass and no thyromegaly present.  Cardiovascular: Normal rate, regular rhythm, normal heart sounds and intact distal pulses.  Pulmonary/Chest: Effort normal and breath sounds normal.  Neurological: She is alert and oriented to person, place, and time. She has normal reflexes.  Skin: Skin is warm and dry.  Psychiatric: She has a normal mood and affect.  Vitals reviewed.   Assessment and Plan :  1. Hypothyroidism, unspecified type Labs pending.  Refills provided.  As long as TSH within normal range, can plan for annual follow-up.  Do recommend having CPE at next visit. - TSH - levothyroxine (SYNTHROID, LEVOTHROID) 75 MCG tablet; Take 1 tablet (75 mcg total) by mouth daily.  Dispense: 90 tablet; Refill: 3  2. Need for shingles vaccine Shingrix vaccine Rx sent to pharmacy. - Zoster Vaccine Adjuvanted [facility]) injection; Inject 0.5 mLs into the muscle once for 1 dose.  Dispense: 0.5 mL; Refill: 0   Tenna Delaine PA-C  [facility] at [facility] 03/07/2018 10:06 AM

## 2018-03-07 NOTE — Patient Instructions (Addendum)
As long as labs are normal, plan for follow up in 6-12 months as planned. Thank you for letting me participate in your health and well being.    If you have lab work done today you will be contacted with your lab results within the next 2 weeks.  If you have not heard from [place] then please contact us. The fastest way to get your results is to register for My Chart.   IF you received an x-ray today, you will receive an invoice from [facility]. Please contact [facility] at [phone] with questions or concerns regarding your invoice.   IF you received labwork today, you will receive an invoice from [place]. Please contact [facility] at [phone] with questions or concerns regarding your invoice.   Our billing staff will not be able to assist you with questions regarding bills from these companies.  You will be contacted with the lab results as soon as they are available. The fastest way to get your results is to activate your My Chart account. Instructions are located on the last page of this paperwork. If you have not heard from [place] regarding the results in 2 weeks, please contact this office.

## 2018-03-08 LAB — TSH: TSH: 2.21 u[IU]/mL (ref 0.450–4.500)

## 2018-04-20 DIAGNOSIS — H0012 Chalazion right lower eyelid: Secondary | ICD-10-CM | POA: Diagnosis not present

## 2018-08-02 ENCOUNTER — Other Ambulatory Visit: Payer: Self-pay

## 2018-08-02 ENCOUNTER — Ambulatory Visit: Payer: BLUE CROSS/BLUE SHIELD | Admitting: Family Medicine

## 2018-08-02 ENCOUNTER — Encounter: Payer: Self-pay | Admitting: Family Medicine

## 2018-08-02 VITALS — BP 114/69 | Ht 65.5 in | Wt 140.0 lb

## 2018-08-02 DIAGNOSIS — M79605 Pain in left leg: Secondary | ICD-10-CM

## 2018-08-02 DIAGNOSIS — M545 Low back pain, unspecified: Secondary | ICD-10-CM

## 2018-08-02 MED ORDER — METHOCARBAMOL 500 MG PO TABS: 500.0000 mg | ORAL_TABLET | Freq: Three times a day (TID) | ORAL | 1 refills | Status: DC | PRN

## 2018-08-02 MED ORDER — DICLOFENAC SODIUM 75 MG PO TBEC: 75.0000 mg | DELAYED_RELEASE_TABLET | Freq: Two times a day (BID) | ORAL | 1 refills | Status: DC

## 2018-08-02 NOTE — Patient Instructions (Signed)
Your pain is due to an irritated nerve in your lumbar spine, less likely piriformis syndrome (your pain is originating too high for this though). Take diclofenac 75mg  twice a day with food for pain and inflammation. Robaxin as needed for spasms. Heat 15 minutes at a time 3-4 times a day as needed. Start home exercises - 3 sets of 10 once a day; for stretches hold for 20-30 seconds, repeat 3 times. Let me know how you're doing in 1 week.

## 2018-08-02 NOTE — Progress Notes (Signed)
PCP: Patient, No Pcp Per  Subjective:   HPI: Patient is a 58 y.o. female here for left back/hip pain.  Patient reports she's had some pain in left side of low back/hip that she's been rehabilitating with exercises she learned in fall for right hamstring strain. She did a 10 mile run on Sunday and this seemed to worsen the pain and in past 2-3 days she's had an unbearable pinching and finding it difficult to get comfortable. Has tried heating pad, ibuprofen with minimal benefit. Better with flexion of back. Radiates down left leg at times to foot with tingling. No prior issues. No bowel/bladder dysfunction. No skin changes.  Past Medical History:  Diagnosis Date  . Depression   . Thyroid disease     Current Outpatient Medications on File Prior to Visit  Medication Sig Dispense Refill  . levothyroxine (SYNTHROID, LEVOTHROID) 75 MCG tablet Take 1 tablet (75 mcg total) by mouth daily. 90 tablet 3  . triamcinolone (NASACORT) 55 MCG/ACT AERO nasal inhaler Place 2 sprays into the nose daily. (Patient not taking: Reported on 08/02/2018) 1 Inhaler 12   No current facility-administered medications on file prior to visit.     Past Surgical History:  Procedure Laterality Date  . OTHER SURGICAL HISTORY     stone in salivary gland removed    No Known Allergies  Social History   Socioeconomic History  . Marital status: Divorced    Spouse name: Not on file  . Number of children: 4  . Years of education: Not on file  . Highest education level: Not on file  Occupational History  . Occupation: regulatory  Social Needs  . Financial resource strain: Not on file  . Food insecurity:    Worry: Not on file    Inability: Not on file  . Transportation needs:    Medical: Not on file    Non-medical: Not on file  Tobacco Use  . Smoking status: Never Smoker  . Smokeless tobacco: Never Used  Substance and Sexual Activity  . Alcohol use: Yes    Alcohol/week: 1.0 standard drinks    Types:  1 Standard drinks or equivalent per week    Comment: glass of wine 3 x a week  . Drug use: No  . Sexual activity: Not Currently  Lifestyle  . Physical activity:    Days per week: Not on file    Minutes per session: Not on file  . Stress: Not on file  Relationships  . Social connections:    Talks on phone: Not on file    Gets together: Not on file    Attends religious service: Not on file    Active member of club or organization: Not on file    Attends meetings of clubs or organizations: Not on file    Relationship status: Not on file  . Intimate partner violence:    Fear of current or ex partner: Not on file    Emotionally abused: Not on file    Physically abused: Not on file    Forced sexual activity: Not on file  Other Topics Concern  . Not on file  Social History Narrative  . Not on file    Family History  Problem Relation Age of Onset  . Cancer Brother   . Hypertension Mother   . Heart disease Father   . Hyperlipidemia Father     BP 114/69   Ht 5' 5.5" (1.664 m)   Wt 140 lb (63.5 kg)  BMI 22.94 kg/m   Review of Systems: See HPI above.     Objective:  Physical Exam:  Gen: NAD, comfortable in exam room  Back: No gross deformity, scoliosis. TTP left paraspinal lumbar region, superior buttock.  No midline or bony TTP. FROM with mild pain on extension. Strength LEs 5/5 all muscle groups.   2+ MSRs in patellar and achilles tendons, equal bilaterally. Negative SLRs. Sensation intact to light touch bilaterally.  Left hip: No deformity. FROM with 5/5 strength. No tenderness to palpation. NVI distally. Negative logroll Negative fabers and piriformis stretches - some relief with piriformis but pain localized more superior.   Assessment & Plan:  1. Left low back pain radiating to left leg - consistent with radiculopathy given origin of pain, current exam, radiation with tingling.  Will start diclofenac, robaxin as needed.  Heat for spasms.  Shown home  rehab exercises for low back as well as piriformis but pain is more superior to this location.  Let us know how she's doing over the next week.

## 2018-09-14 DIAGNOSIS — H0015 Chalazion left lower eyelid: Secondary | ICD-10-CM | POA: Diagnosis not present

## 2018-12-19 ENCOUNTER — Ambulatory Visit: Payer: BC Managed Care – PPO | Admitting: Family Medicine

## 2018-12-19 ENCOUNTER — Other Ambulatory Visit: Payer: Self-pay

## 2018-12-19 DIAGNOSIS — M79675 Pain in left toe(s): Secondary | ICD-10-CM | POA: Diagnosis not present

## 2018-12-20 ENCOUNTER — Encounter: Payer: Self-pay | Admitting: Family Medicine

## 2018-12-20 NOTE — Progress Notes (Signed)
PCP: Patient, No Pcp Per  Subjective:   HPI: Patient is a 58 y.o. female here for left 3rd digit swelling.  Patient reports she's currently up to running 17-20 miles for her long run. Overall doing well but noticed over past 2-3 months that she's had some swelling, mild redness of distal portion of left 3rd toe. Pain is minimal, maybe a mild soreness. No acute injury/trauma.  Past Medical History:  Diagnosis Date  . Depression   . Thyroid disease     Current Outpatient Medications on File Prior to Visit  Medication Sig Dispense Refill  . diclofenac (VOLTAREN) 75 MG EC tablet Take 1 tablet (75 mg total) by mouth 2 (two) times daily. 60 tablet 1  . levothyroxine (SYNTHROID, LEVOTHROID) 75 MCG tablet Take 1 tablet (75 mcg total) by mouth daily. 90 tablet 3  . methocarbamol (ROBAXIN) 500 MG tablet Take 1 tablet (500 mg total) by mouth every 8 (eight) hours as needed. 60 tablet 1  . triamcinolone (NASACORT) 55 MCG/ACT AERO nasal inhaler Place 2 sprays into the nose daily. (Patient not taking: Reported on 08/02/2018) 1 Inhaler 12   No current facility-administered medications on file prior to visit.     Past Surgical History:  Procedure Laterality Date  . OTHER SURGICAL HISTORY     stone in salivary gland removed    No Known Allergies  Social History   Socioeconomic History  . Marital status: Divorced    Spouse name: Not on file  . Number of children: 4  . Years of education: Not on file  . Highest education level: Not on file  Occupational History  . Occupation: regulatory  Social Needs  . Financial resource strain: Not on file  . Food insecurity    Worry: Not on file    Inability: Not on file  . Transportation needs    Medical: Not on file    Non-medical: Not on file  Tobacco Use  . Smoking status: Never Smoker  . Smokeless tobacco: Never Used  Substance and Sexual Activity  . Alcohol use: Yes    Alcohol/week: 1.0 standard drinks    Types: 1 Standard drinks or  equivalent per week    Comment: glass of wine 3 x a week  . Drug use: No  . Sexual activity: Not Currently  Lifestyle  . Physical activity    Days per week: Not on file    Minutes per session: Not on file  . Stress: Not on file  Relationships  . Social Herbalist on phone: Not on file    Gets together: Not on file    Attends religious service: Not on file    Active member of club or organization: Not on file    Attends meetings of clubs or organizations: Not on file    Relationship status: Not on file  . Intimate partner violence    Fear of current or ex partner: Not on file    Emotionally abused: Not on file    Physically abused: Not on file    Forced sexual activity: Not on file  Other Topics Concern  . Not on file  Social History Narrative  . Not on file    Family History  Problem Relation Age of Onset  . Cancer Brother   . Hypertension Mother   . Heart disease Father   . Hyperlipidemia Father     BP 104/62   Ht 5' 5.5" (1.664 m)   Wt 138 lb (  62.6 kg)   BMI 22.62 kg/m   Review of Systems: See HPI above.     Objective:  Physical Exam:  Gen: NAD, comfortable in exam room  Left foot/ankle: Mild swelling, redness over distal phalanx.  No streaking.  No purulence.  Transverse arch collapse.  No other deformity. FROM with 5/5 strength all directions. No TTP Negative ant drawer and talar tilt.   Thompsons test negative. NV intact distally.   Assessment & Plan:  1. Left 3rd digit swelling - noted her metatarsal pad has worn out, is several months old - replaced this.  This was leading to increased friction of dorsal aspect 3rd digit on shoe when running.  No other concerning findings.  F/u prn.

## 2019-02-24 ENCOUNTER — Other Ambulatory Visit: Payer: Self-pay | Admitting: Emergency Medicine

## 2019-02-24 DIAGNOSIS — E039 Hypothyroidism, unspecified: Secondary | ICD-10-CM

## 2019-02-24 MED ORDER — LEVOTHYROXINE SODIUM 75 MCG PO TABS: 75.0000 ug | ORAL_TABLET | Freq: Every day | ORAL | 0 refills | Status: DC

## 2019-02-24 NOTE — Telephone Encounter (Signed)
Requested medication (s) are due for refill today: yes  Requested medication (s) are on the active medication list: yes  Last refill:  03/07/2018  Future visit scheduled: no  Notes to clinic:  Review for refill Overdue for office visit    Requested Prescriptions  Pending Prescriptions Disp Refills   levothyroxine (SYNTHROID) 75 MCG tablet 90 tablet 3    Sig: Take 1 tablet (75 mcg total) by mouth daily.     Endocrinology:  Hypothyroid Agents Failed - 02/24/2019 12:41 PM      Failed - TSH needs to be rechecked within 3 months after an abnormal result. Refill until TSH is due.      Passed - TSH in normal range and within 360 days    TSH  Date Value Ref Range Status  03/07/2018 2.210 0.450 - 4.500 uIU/mL Final         Passed - Valid encounter within last 12 months    Recent Outpatient Visits          11 months ago Hypothyroidism, unspecified type   [facility] at [facility], [place] D, PA-C   1 year ago Allergic dermatitis   [facility] at [place], PA-C   1 year ago Sinus congestion   Primary Care at [facility], Ines Bloomer, MD   1 year ago Hypothyroidism, unspecified type   Primary Care at [place], [place], [place]   2 years ago Worried well   [facility] at [facility], Kayleen Memos, MD

## 2019-02-24 NOTE — Telephone Encounter (Signed)
Wanda Riley is no longer here need to be seen for further refills. Please schedule

## 2019-02-24 NOTE — Telephone Encounter (Signed)
Medication Refill - Medication: levothyroxine (SYNTHROID, LEVOTHROID) 75 MCG tablet   Preferred Pharmacy (with phone number or street name):  [facility] [place], [place] [address] [phone] (Phone) [phone] (Fax)

## 2019-03-03 NOTE — Telephone Encounter (Signed)
Scheduled appt for med refills and cpe

## 2019-03-10 ENCOUNTER — Other Ambulatory Visit: Payer: Self-pay

## 2019-03-10 ENCOUNTER — Encounter: Payer: Self-pay | Admitting: Registered Nurse

## 2019-03-10 ENCOUNTER — Ambulatory Visit (INDEPENDENT_AMBULATORY_CARE_PROVIDER_SITE_OTHER): Payer: BC Managed Care – PPO | Admitting: Registered Nurse

## 2019-03-10 VITALS — BP 109/71 | HR 73 | Temp 97.7°F | Resp 16 | Ht 65.35 in | Wt 142.0 lb

## 2019-03-10 DIAGNOSIS — Z0001 Encounter for general adult medical examination with abnormal findings: Secondary | ICD-10-CM

## 2019-03-10 DIAGNOSIS — Z23 Encounter for immunization: Secondary | ICD-10-CM | POA: Diagnosis not present

## 2019-03-10 DIAGNOSIS — Z130 Encounter for screening for diseases of the blood and blood-forming organs and certain disorders involving the immune mechanism: Secondary | ICD-10-CM | POA: Diagnosis not present

## 2019-03-10 DIAGNOSIS — Z1329 Encounter for screening for other suspected endocrine disorder: Secondary | ICD-10-CM

## 2019-03-10 DIAGNOSIS — Z0000 Encounter for general adult medical examination without abnormal findings: Secondary | ICD-10-CM

## 2019-03-10 DIAGNOSIS — Z13228 Encounter for screening for other metabolic disorders: Secondary | ICD-10-CM | POA: Diagnosis not present

## 2019-03-10 DIAGNOSIS — Z13220 Encounter for screening for lipoid disorders: Secondary | ICD-10-CM | POA: Diagnosis not present

## 2019-03-10 DIAGNOSIS — E039 Hypothyroidism, unspecified: Secondary | ICD-10-CM | POA: Diagnosis not present

## 2019-03-10 NOTE — Progress Notes (Signed)
Established Patient Office Visit  Subjective:  Patient ID: Wanda Riley, female    DOB: Nov 29, 1960  Age: 58 y.o. MRN: [id number]  CC:  Chief Complaint  Patient presents with  . Annual Exam    HPI Wanda Riley presents for CPE and labs  History significant for depression and hypothyroidism. Does not wish to pursue tx for depression Hypothyroidism treated with levothyroxine 13mcg PO qd with good effect. States that this dose has been stable for years. No concerns for heat/cold intolerance, weight gain/loss, palpitations, sleep disturbance, or any other symptoms of thyroid dysfunction.  Otherwise feels well. Distance runner - recent ran [employer] virtually.   Past Medical History:  Diagnosis Date  . Depression   . Thyroid disease     Past Surgical History:  Procedure Laterality Date  . OTHER SURGICAL HISTORY     stone in salivary gland removed    Family History  Problem Relation Age of Onset  . Cancer Brother   . Hypertension Mother   . Lung cancer Mother   . Heart disease Father   . Hyperlipidemia Father     Social History   Socioeconomic History  . Marital status: Divorced    Spouse name: Not on file  . Number of children: 4  . Years of education: Not on file  . Highest education level: Not on file  Occupational History  . Occupation: regulatory  Social Needs  . Financial resource strain: Not hard at all  . Food insecurity    Worry: Never true    Inability: Never true  . Transportation needs    Medical: No    Non-medical: No  Tobacco Use  . Smoking status: Never Smoker  . Smokeless tobacco: Never Used  Substance and Sexual Activity  . Alcohol use: Yes    Alcohol/week: 1.0 standard drinks    Types: 1 Standard drinks or equivalent per week    Comment: glass of wine 3 x a week  . Drug use: No  . Sexual activity: Not Currently  Lifestyle  . Physical activity    Days per week: 5 days    Minutes per session: 60 min  . Stress: Only a  little  Relationships  . Social Herbalist on phone: Three times a week    Gets together: Twice a week    Attends religious service: Patient refused    Active member of club or organization: Patient refused    Attends meetings of clubs or organizations: Patient refused    Relationship status: Patient refused  . Intimate partner violence    Fear of current or ex partner: No    Emotionally abused: No    Physically abused: No    Forced sexual activity: No  Other Topics Concern  . Not on file  Social History Narrative   Single   4 kids - 20, 59, 60, 41. They are good source of support   Works for [employer] - likes work a lot    Outpatient Medications Prior to Visit  Medication Sig Dispense Refill  . levothyroxine (SYNTHROID) 75 MCG tablet Take 1 tablet (75 mcg total) by mouth daily. 30 tablet 0  . triamcinolone (NASACORT) 55 MCG/ACT AERO nasal inhaler Place 2 sprays into the nose daily. 1 Inhaler 12  . diclofenac (VOLTAREN) 75 MG EC tablet Take 1 tablet (75 mg total) by mouth 2 (two) times daily. 60 tablet 1  . methocarbamol (ROBAXIN) 500 MG tablet Take 1 tablet (  500 mg total) by mouth every 8 (eight) hours as needed. 60 tablet 1   No facility-administered medications prior to visit.     No Known Allergies  ROS Review of Systems  Constitutional: Negative.   HENT: Negative.   Eyes: Negative.   Respiratory: Negative.   Cardiovascular: Negative.   Gastrointestinal: Negative.   Endocrine: Negative.   Genitourinary: Negative.   Musculoskeletal: Negative.   Skin: Negative.   Allergic/Immunologic: Negative.   Neurological: Negative.   Hematological: Negative.   Psychiatric/Behavioral: Negative.   All other systems reviewed and are negative.     Objective:    Physical Exam  Constitutional: She is oriented to person, place, and time. She appears well-developed and well-nourished. No distress.  HENT:  Head: Normocephalic and atraumatic.  Right Ear: External ear  normal.  Left Ear: External ear normal.  Nose: Nose normal.  Mouth/Throat: Oropharynx is clear and moist. No oropharyngeal exudate.  Eyes: Pupils are equal, round, and reactive to light. Conjunctivae and EOM are normal. Right eye exhibits no discharge. Left eye exhibits no discharge. No scleral icterus.  Neck: Normal range of motion. Neck supple. No tracheal deviation present. No thyromegaly present.  Cardiovascular: Normal rate, regular rhythm, normal heart sounds and intact distal pulses. Exam reveals no gallop and no friction rub.  No murmur heard. Pulmonary/Chest: Effort normal and breath sounds normal. No respiratory distress. She has no wheezes. She has no rales. She exhibits no tenderness.  Abdominal: Soft. Bowel sounds are normal. She exhibits no distension and no mass. There is no abdominal tenderness. There is no rebound and no guarding.  Musculoskeletal: Normal range of motion.        General: No tenderness, deformity or edema.  Lymphadenopathy:    She has no cervical adenopathy.  Neurological: She is alert and oriented to person, place, and time. No cranial nerve deficit. She exhibits normal muscle tone. Coordination normal.  Skin: Skin is warm and dry. No rash noted. She is not diaphoretic. No erythema. No pallor.  Psychiatric: She has a normal mood and affect. Her behavior is normal. Judgment and thought content normal.  Nursing note and vitals reviewed.   BP 109/71   Pulse 73   Temp 97.7 F (36.5 C) (Oral)   Resp 16   Ht 5' 5.35" (1.66 m)   Wt 142 lb (64.4 kg)   SpO2 99%   BMI 23.37 kg/m  Wt Readings from Last 3 Encounters:  03/10/19 142 lb (64.4 kg)  12/19/18 138 lb (62.6 kg)  08/02/18 140 lb (63.5 kg)     Health Maintenance Due  Topic Date Due  . Hepatitis C Screening  [birth year]  . HIV Screening  03/03/1975  . MAMMOGRAM  05/27/2013  . PAP SMEAR-Modifier  05/19/2015  . TETANUS/TDAP  02/16/2017  . INFLUENZA VACCINE  12/03/2018    There are no preventive  care reminders to display for this patient.  Lab Results  Component Value Date   TSH 2.210 03/07/2018   Lab Results  Component Value Date   WBC 5.5 01/20/2016   HGB 14.2 01/20/2016   HCT 39.5 01/20/2016   MCV 85.8 01/20/2016   PLT 288 03/19/2014   Lab Results  Component Value Date   NA 137 01/20/2016   K 4.1 01/20/2016   CO2 25 01/20/2016   GLUCOSE 101 (H) 01/20/2016   BUN 10 01/20/2016   CREATININE 0.58 01/20/2016   BILITOT 0.3 01/20/2016   ALKPHOS 70 01/20/2016   AST 15 01/20/2016   ALT  12 01/20/2016   PROT 7.1 01/20/2016   ALBUMIN 4.7 01/20/2016   CALCIUM 9.4 01/20/2016   Lab Results  Component Value Date   CHOL 259 (H) 03/19/2014   Lab Results  Component Value Date   HDL 92 03/19/2014   Lab Results  Component Value Date   LDLCALC 151 (H) 03/19/2014   Lab Results  Component Value Date   TRIG 79 03/19/2014   Lab Results  Component Value Date   CHOLHDL 2.8 03/19/2014   No results found for: HGBA1C    Assessment & Plan:   Problem List Items Addressed This Visit      Endocrine   Hypothyroid   Relevant Orders   TSH    Other Visit Diagnoses    Flu vaccine need    -  Primary   Relevant Orders   Influenza (Seasonal) (Completed)   Screening for endocrine, metabolic and immunity disorder       Relevant Orders   CBC with Differential   Comprehensive metabolic panel   Hemoglobin A1c   TSH   Lipid screening       Relevant Orders   Lipid panel   Routine general medical examination at a health care facility          No orders of the defined types were placed in this encounter.   Follow-up: Return in 1 year (on 03/09/2020).   PLAN  Normal findings on exam  Labs drawn - will refill levothyroxine at current dose with normal TSH  Refill for 1 year if normal TSH, otherwise, will work out adjustment plan with patient  Pt up to date on mammography and pap through [facility] provider. Will request records  Patient encouraged to call clinic with any  questions, comments, or concerns.   Maximiano Coss, NP

## 2019-03-10 NOTE — Patient Instructions (Addendum)
   If you have lab work done today you will be contacted with your lab results within the next 2 weeks.  If you have not heard from us then please contact us. The fastest way to get your results is to register for My Chart.   IF you received an x-ray today, you will receive an invoice from [facility]. Please contact [facility] at [phone] with questions or concerns regarding your invoice.   IF you received labwork today, you will receive an invoice from [facility]. Please contact [facility] at [phone] with questions or concerns regarding your invoice.   Our billing staff will not be able to assist you with questions regarding bills from these companies.  You will be contacted with the lab results as soon as they are available. The fastest way to get your results is to activate your My Chart account. Instructions are located on the last page of this paperwork. If you have not heard from us regarding the results in 2 weeks, please contact this office.       Health Maintenance, Female Adopting a healthy lifestyle and getting preventive care are important in promoting health and wellness. Ask your health care provider about:  The right schedule for you to have regular tests and exams.  Things you can do on your own to prevent diseases and keep yourself healthy. What should I know about diet, weight, and exercise? Eat a healthy diet   Eat a diet that includes plenty of vegetables, fruits, low-fat dairy products, and lean protein.  Do not eat a lot of foods that are high in solid fats, added sugars, or sodium. Maintain a healthy weight Body mass index (BMI) is used to identify weight problems. It estimates body fat based on height and weight. Your health care provider can help determine your BMI and help you achieve or maintain a healthy weight. Get regular exercise Get regular exercise. This is one of the most important things you can do for your health. Most  adults should:  Exercise for at least 150 minutes each week. The exercise should increase your heart rate and make you sweat (moderate-intensity exercise).  Do strengthening exercises at least twice a week. This is in addition to the moderate-intensity exercise.  Spend less time sitting. Even light physical activity can be beneficial. Watch cholesterol and blood lipids Have your blood tested for lipids and cholesterol at 59 years of age, then have this test every 5 years. Have your cholesterol levels checked more often if:  Your lipid or cholesterol levels are high.  You are older than 59 years of age.  You are at high risk for heart disease. What should I know about cancer screening? Depending on your health history and family history, you may need to have cancer screening at various ages. This may include screening for:  Breast cancer.  Cervical cancer.  Colorectal cancer.  Skin cancer.  Lung cancer. What should I know about heart disease, diabetes, and high blood pressure? Blood pressure and heart disease  High blood pressure causes heart disease and increases the risk of stroke. This is more likely to develop in people who have high blood pressure readings, are of African descent, or are overweight.  Have your blood pressure checked: ? Every 3-5 years if you are 18-39 years of age. ? Every year if you are 40 years old or older. Diabetes Have regular diabetes screenings. This checks your fasting blood sugar level. Have the screening done:  Once   every three years after age 40 if you are at a normal weight and have a low risk for diabetes.  More often and at a younger age if you are overweight or have a high risk for diabetes. What should I know about preventing infection? Hepatitis B If you have a higher risk for hepatitis B, you should be screened for this virus. Talk with your health care provider to find out if you are at risk for hepatitis B infection. Hepatitis  C Testing is recommended for:  Everyone born from 1945 through 1965.  Anyone with known risk factors for hepatitis C. Sexually transmitted infections (STIs)  Get screened for STIs, including gonorrhea and chlamydia, if: ? You are sexually active and are younger than 59 years of age. ? You are older than 59 years of age and your health care provider tells you that you are at risk for this type of infection. ? Your sexual activity has changed since you were last screened, and you are at increased risk for chlamydia or gonorrhea. Ask your health care provider if you are at risk.  Ask your health care provider about whether you are at high risk for HIV. Your health care provider may recommend a prescription medicine to help prevent HIV infection. If you choose to take medicine to prevent HIV, you should first get tested for HIV. You should then be tested every 3 months for as long as you are taking the medicine. Pregnancy  If you are about to stop having your period (premenopausal) and you may become pregnant, seek counseling before you get pregnant.  Take 400 to 800 micrograms (mcg) of folic acid every day if you become pregnant.  Ask for birth control (contraception) if you want to prevent pregnancy. Osteoporosis and menopause Osteoporosis is a disease in which the bones lose minerals and strength with aging. This can result in bone fractures. If you are 65 years old or older, or if you are at risk for osteoporosis and fractures, ask your health care provider if you should:  Be screened for bone loss.  Take a calcium or vitamin D supplement to lower your risk of fractures.  Be given hormone replacement therapy (HRT) to treat symptoms of menopause. Follow these instructions at home: Lifestyle  Do not use any products that contain nicotine or tobacco, such as cigarettes, e-cigarettes, and chewing tobacco. If you need help quitting, ask your health care provider.  Do not use street  drugs.  Do not share needles.  Ask your health care provider for help if you need support or information about quitting drugs. Alcohol use  Do not drink alcohol if: ? Your health care provider tells you not to drink. ? You are pregnant, may be pregnant, or are planning to become pregnant.  If you drink alcohol: ? Limit how much you use to 0-1 drink a day. ? Limit intake if you are breastfeeding.  Be aware of how much alcohol is in your drink. In the [place] one drink equals one 12 oz bottle of beer (355 mL), one 5 oz glass of wine (148 mL), or one 1 oz glass of hard liquor (44 mL). General instructions  Schedule regular health, dental, and eye exams.  Stay current with your vaccines.  Tell your health care provider if: ? You often feel depressed. ? You have ever been abused or do not feel safe at home. Summary  Adopting a healthy lifestyle and getting preventive care are important in promoting health and   wellness.  Follow your health care provider's instructions about healthy diet, exercising, and getting tested or screened for diseases.  Follow your health care provider's instructions on monitoring your cholesterol and blood pressure. This information is not intended to replace advice given to you by your health care provider. Make sure you discuss any questions you have with your health care provider. Document Released: 11/03/2010 Document Revised: 04/13/2018 Document Reviewed: 04/13/2018 Elsevier Patient Education  2020 [employer]vier Inc.     Why follow it? Research shows. . Those who follow the Mediterranean diet have a reduced risk of heart disease  . The diet is associated with a reduced incidence of Parkinson's and Alzheimer's diseases . People following the diet may have longer life expectancies and lower rates of chronic diseases  . The Dietary Guidelines for Americans recommends the Mediterranean diet as an eating plan to promote health and prevent disease  What Is the  Mediterranean Diet?  . Healthy eating plan based on typical foods and recipes of Mediterranean-style cooking . The diet is primarily a plant based diet; these foods should make up a majority of meals   Starches - Plant based foods should make up a majority of meals - They are an important sources of vitamins, minerals, energy, antioxidants, and fiber - Choose whole grains, foods high in fiber and minimally processed items  - Typical grain sources include wheat, oats, barley, corn, brown rice, bulgar, farro, millet, polenta, couscous  - Various types of beans include chickpeas, lentils, fava beans, black beans, white beans   Fruits  Veggies - Large quantities of antioxidant rich fruits & veggies; 6 or more servings  - Vegetables can be eaten raw or lightly drizzled with oil and cooked  - Vegetables common to the traditional Mediterranean Diet include: artichokes, arugula, beets, broccoli, brussel sprouts, cabbage, carrots, celery, collard greens, cucumbers, eggplant, kale, leeks, lemons, lettuce, mushrooms, okra, onions, peas, peppers, potatoes, pumpkin, radishes, rutabaga, shallots, spinach, sweet potatoes, turnips, zucchini - Fruits common to the Mediterranean Diet include: apples, apricots, avocados, cherries, clementines, dates, figs, grapefruits, grapes, melons, nectarines, oranges, peaches, pears, pomegranates, strawberries, tangerines  Fats - Replace butter and margarine with healthy oils, such as olive oil, canola oil, and tahini  - Limit nuts to no more than a handful a day  - Nuts include walnuts, almonds, pecans, pistachios, pine nuts  - Limit or avoid candied, honey roasted or heavily salted nuts - Olives are central to the Mediterranean diet - can be eaten whole or used in a variety of dishes   Meats Protein - Limiting red meat: no more than a few times a month - When eating red meat: choose lean cuts and keep the portion to the size of deck of cards - Eggs: approx. 0 to 4 times a  week  - Fish and lean poultry: at least 2 a week  - Healthy protein sources include, chicken, turkey, lean beef, lamb - Increase intake of seafood such as tuna, salmon, trout, mackerel, shrimp, scallops - Avoid or limit high fat processed meats such as sausage and bacon  Dairy - Include moderate amounts of low fat dairy products  - Focus on healthy dairy such as fat free yogurt, skim milk, low or reduced fat cheese - Limit dairy products higher in fat such as whole or 2% milk, cheese, ice cream  Alcohol - Moderate amounts of red wine is ok  - No more than 5 oz daily for women (all ages) and men older than age 65  -   No more than 10 oz of wine daily for men younger than 65  Other - Limit sweets and other desserts  - Use herbs and spices instead of salt to flavor foods  - Herbs and spices common to the traditional Mediterranean Diet include: basil, bay leaves, chives, cloves, cumin, fennel, garlic, lavender, marjoram, mint, oregano, parsley, pepper, rosemary, sage, savory, sumac, tarragon, thyme   It's not just a diet, it's a lifestyle:  . The Mediterranean diet includes lifestyle factors typical of those in the region  . Foods, drinks and meals are best eaten with others and savored . Daily physical activity is important for overall good health . This could be strenuous exercise like running and aerobics . This could also be more leisurely activities such as walking, housework, yard-work, or taking the stairs . Moderation is the key; a balanced and healthy diet accommodates most foods and drinks . Consider portion sizes and frequency of consumption of certain foods   Meal Ideas & Options:  . Breakfast:  o Whole wheat toast or whole wheat English muffins with peanut butter & hard boiled egg o Steel cut oats topped with apples & cinnamon and skim milk  o Fresh fruit: banana, strawberries, melon, berries, peaches  o Smoothies: strawberries, bananas, greek yogurt, peanut butter o Low fat  greek yogurt with blueberries and granola  o Egg white omelet with spinach and mushrooms o Breakfast couscous: whole wheat couscous, apricots, skim milk, cranberries  . Sandwiches:  o Hummus and grilled vegetables (peppers, zucchini, squash) on whole wheat bread   o Grilled chicken on whole wheat pita with lettuce, tomatoes, cucumbers or tzatziki  o Tuna salad on whole wheat bread: tuna salad made with greek yogurt, olives, red peppers, capers, green onions o Garlic rosemary lamb pita: lamb sauted with garlic, rosemary, salt & pepper; add lettuce, cucumber, greek yogurt to pita - flavor with lemon juice and black pepper  . Seafood:  o Mediterranean grilled salmon, seasoned with garlic, basil, parsley, lemon juice and black pepper o Shrimp, lemon, and spinach whole-grain pasta salad made with low fat greek yogurt  o Seared scallops with lemon orzo  o Seared tuna steaks seasoned salt, pepper, coriander topped with tomato mixture of olives, tomatoes, olive oil, minced garlic, parsley, green onions and cappers  . Meats:  o Herbed greek chicken salad with kalamata olives, cucumber, feta  o Red bell peppers stuffed with spinach, bulgur, lean ground beef (or lentils) & topped with feta   o Kebabs: skewers of chicken, tomatoes, onions, zucchini, squash  o Turkey burgers: made with red onions, mint, dill, lemon juice, feta cheese topped with roasted red peppers . Vegetarian o Cucumber salad: cucumbers, artichoke hearts, celery, red onion, feta cheese, tossed in olive oil & lemon juice  o Hummus and whole grain pita points with a greek salad (lettuce, tomato, feta, olives, cucumbers, red onion) o Lentil soup with celery, carrots made with vegetable broth, garlic, salt and pepper  o Tabouli salad: parsley, bulgur, mint, scallions, cucumbers, tomato, radishes, lemon juice, olive oil, salt and pepper.       Fat and Cholesterol Restricted Eating Plan Eating a diet that limits fat and cholesterol may  help lower your risk for heart disease and other conditions. Your body needs fat and cholesterol for basic functions, but eating too much of these things can be harmful to your health. Your health care provider may order lab tests to check your blood fat (lipid) and cholesterol levels. This helps your   health care provider understand your risk for certain conditions and whether you need to make diet changes. Work with your health care provider or dietitian to make an eating plan that is right for you. Your plan includes:  Limit your fat intake to ______% or less of your total calories a day.  Limit your saturated fat intake to ______% or less of your total calories a day.  Limit the amount of cholesterol in your diet to less than _________mg a day.  Eat ___________ g of fiber a day. What are tips for following this plan? General guidelines   If you are overweight, work with your health care provider to lose weight safely. Losing just 5-10% of your body weight can improve your overall health and help prevent diseases such as diabetes and heart disease.  Avoid: ? Foods with added sugar. ? Fried foods. ? Foods that contain partially hydrogenated oils, including stick margarine, some tub margarines, cookies, crackers, and other baked goods.  Limit alcohol intake to no more than 1 drink a day for nonpregnant women and 2 drinks a day for men. One drink equals 12 oz of beer, 5 oz of wine, or 1 oz of hard liquor. Reading food labels  Check food labels for: ? Trans fats, partially hydrogenated oils, or high amounts of saturated fat. Avoid foods that contain saturated fat and trans fat. ? The amount of cholesterol in each serving. Try to eat no more than 200 mg of cholesterol each day. ? The amount of fiber in each serving. Try to eat at least 20-30 g of fiber each day.  Choose foods with healthy fats, such as: ? Monounsaturated and polyunsaturated fats. These include olive and canola oil,  flaxseeds, walnuts, almonds, and seeds. ? Omega-3 fats. These are found in foods such as salmon, mackerel, sardines, tuna, flaxseed oil, and ground flaxseeds.  Choose grain products that have whole grains. Look for the word "whole" as the first word in the ingredient list. Cooking  Cook foods using methods other than frying. Baking, boiling, grilling, and broiling are some healthy options.  Eat more home-cooked food and less restaurant, buffet, and fast food.  Avoid cooking using saturated fats. ? Animal sources of saturated fats include meats, butter, and cream. ? Plant sources of saturated fats include palm oil, palm kernel oil, and coconut oil. Meal planning   At meals, imagine dividing your plate into fourths: ? Fill one-half of your plate with vegetables and green salads. ? Fill one-fourth of your plate with whole grains. ? Fill one-fourth of your plate with lean protein foods.  Eat fish that is high in omega-3 fats at least two times a week.  Eat more foods that contain fiber, such as whole grains, beans, apples, broccoli, carrots, peas, and barley. These foods help promote healthy cholesterol levels in the blood. Recommended foods Grains  Whole grains, such as whole wheat or whole grain breads, crackers, cereals, and pasta. Unsweetened oatmeal, bulgur, barley, quinoa, or brown rice. Corn or whole wheat flour tortillas. Vegetables  Fresh or frozen vegetables (raw, steamed, roasted, or grilled). Green salads. Fruits  All fresh, canned (in natural juice), or frozen fruits. Meats and other protein foods  Ground beef (85% or leaner), grass-fed beef, or beef trimmed of fat. Skinless chicken or turkey. Ground chicken or turkey. Pork trimmed of fat. All fish and seafood. Egg whites. Dried beans, peas, or lentils. Unsalted nuts or seeds. Unsalted canned beans. Natural nut butters without added sugar and oil. Dairy    Low-fat or nonfat dairy products, such as skim or 1% milk, 2% or  reduced-fat cheeses, low-fat and fat-free ricotta or cottage cheese, or plain low-fat and nonfat yogurt. Fats and oils  Tub margarine without trans fats. Light or reduced-fat mayonnaise and salad dressings. Avocado. Olive, canola, sesame, or safflower oils. The items listed above may not be a complete list of recommended foods or beverages. Contact your dietitian for more options. Foods to avoid Grains  White bread. White pasta. White rice. Cornbread. Bagels, pastries, and croissants. Crackers and snack foods that contain trans fat and hydrogenated oils. Vegetables  Vegetables cooked in cheese, cream, or butter sauce. Fried vegetables. Fruits  Canned fruit in heavy syrup. Fruit in cream or butter sauce. Fried fruit. Meats and other protein foods  Fatty cuts of meat. Ribs, chicken wings, bacon, sausage, bologna, salami, chitterlings, fatback, hot dogs, bratwurst, and packaged lunch meats. Liver and organ meats. Whole eggs and egg yolks. Chicken and turkey with skin. Fried meat. Dairy  Whole or 2% milk, cream, half-and-half, and cream cheese. Whole milk cheeses. Whole-fat or sweetened yogurt. Full-fat cheeses. Nondairy creamers and whipped toppings. Processed cheese, cheese spreads, and cheese curds. Beverages  Alcohol. Sugar-sweetened drinks such as sodas, lemonade, and fruit drinks. Fats and oils  Butter, stick margarine, lard, shortening, ghee, or bacon fat. Coconut, palm kernel, and palm oils. Sweets and desserts  Corn syrup, sugars, honey, and molasses. Candy. Jam and jelly. Syrup. Sweetened cereals. Cookies, pies, cakes, donuts, muffins, and ice cream. The items listed above may not be a complete list of foods and beverages to avoid. Contact your dietitian for more information. Summary  Your body needs fat and cholesterol for basic functions. However, eating too much of these things can be harmful to your health.  Work with your health care provider and dietitian to follow a  diet low in fat and cholesterol. Doing this may help lower your risk for heart disease and other conditions.  Choose healthy fats, such as monounsaturated and polyunsaturated fats, and foods high in omega-3 fatty acids.  Eat fiber-rich foods, such as whole grains, beans, peas, fruits, and vegetables.  Limit or avoid alcohol, fried foods, and foods high in saturated fats, partially hydrogenated oils, and sugar. This information is not intended to replace advice given to you by your health care provider. Make sure you discuss any questions you have with your health care provider. Document Released: 04/20/2005 Document Revised: 04/02/2017 Document Reviewed: 01/05/2017 Elsevier Patient Education  2020 [employer] Inc.  American Heart Association (AHA) Exercise Recommendation  Being physically active is important to prevent heart disease and stroke, the nation's No. 1and No. 5killers. To improve overall cardiovascular health, we suggest at least 150 minutes per week of moderate exercise or 75 minutes per week of vigorous exercise (or a combination of moderate and vigorous activity). Thirty minutes a day, five times a week is an easy goal to remember. You will also experience benefits even if you divide your time into two or three segments of 10 to 15 minutes per day.  For people who would benefit from lowering their blood pressure or cholesterol, we recommend 40 minutes of aerobic exercise of moderate to vigorous intensity three to four times a week to lower the risk for heart attack and stroke.  Physical activity is anything that makes you move your body and burn calories.  This includes things like climbing stairs or playing sports. Aerobic exercises benefit your heart, and include walking, jogging, swimming or biking. Strength and   stretching exercises are best for overall stamina and flexibility.  The simplest, positive change you can make to effectively improve your heart health is to start  walking. It's enjoyable, free, easy, social and great exercise. A walking program is flexible and boasts high success rates because people can stick with it. It's easy for walking to become a regular and satisfying part of life.   For Overall Cardiovascular Health:  At least 30 minutes of moderate-intensity aerobic activity at least 5 days per week for a total of 150  OR   At least 25 minutes of vigorous aerobic activity at least 3 days per week for a total of 75 minutes; or a combination of moderate- and vigorous-intensity aerobic activity  AND   Moderate- to high-intensity muscle-strengthening activity at least 2 days per week for additional health benefits.  For Lowering Blood Pressure and Cholesterol  An average 40 minutes of moderate- to vigorous-intensity aerobic activity 3 or 4 times per week  What if I can't make it to the time goal? Something is always better than nothing! And everyone has to start somewhere. Even if you've been sedentary for years, today is the day you can begin to make healthy changes in your life. If you don't think you'll make it for 30 or 40 minutes, set a reachable goal for today. You can work up toward your overall goal by increasing your time as you get stronger. Don't let all-or-nothing thinking rob you of doing what you can every day.  Source:http://www.heart.org    

## 2019-03-11 LAB — TSH: TSH: 2.34 u[IU]/mL (ref 0.450–4.500)

## 2019-03-11 LAB — COMPREHENSIVE METABOLIC PANEL
ALT: 14 IU/L (ref 0–32)
AST: 21 IU/L (ref 0–40)
Albumin/Globulin Ratio: 2 (ref 1.2–2.2)
Albumin: 4.5 g/dL (ref 3.8–4.9)
Alkaline Phosphatase: 76 IU/L (ref 39–117)
BUN/Creatinine Ratio: 21 (ref 9–23)
BUN: 15 mg/dL (ref 6–24)
Bilirubin Total: 0.3 mg/dL (ref 0.0–1.2)
CO2: 26 mmol/L (ref 20–29)
Calcium: 9.8 mg/dL (ref 8.7–10.2)
Chloride: 99 mmol/L (ref 96–106)
Creatinine, Ser: 0.71 mg/dL (ref 0.57–1.00)
GFR calc Af Amer: 108 mL/min/{1.73_m2} (ref >59)
GFR calc non Af Amer: 94 mL/min/{1.73_m2} (ref >59)
Globulin, Total: 2.3 g/dL (ref 1.5–4.5)
Glucose: 93 mg/dL (ref 65–99)
Potassium: 5.2 mmol/L (ref 3.5–5.2)
Sodium: 140 mmol/L (ref 134–144)
Total Protein: 6.8 g/dL (ref 6.0–8.5)

## 2019-03-11 LAB — CBC WITH DIFFERENTIAL/PLATELET
Basophils Absolute: 0.1 10*3/uL (ref 0.0–0.2)
Basos: 2 %
EOS (ABSOLUTE): 0.4 10*3/uL (ref 0.0–0.4)
Eos: 11 %
Hematocrit: 42.1 % (ref 34.0–46.6)
Hemoglobin: 14.1 g/dL (ref 11.1–15.9)
Immature Grans (Abs): 0 10*3/uL (ref 0.0–0.1)
Immature Granulocytes: 0 %
Lymphocytes Absolute: 1.5 10*3/uL (ref 0.7–3.1)
Lymphs: 38 %
MCH: 30.1 pg (ref 26.6–33.0)
MCHC: 33.5 g/dL (ref 31.5–35.7)
MCV: 90 fL (ref 79–97)
Monocytes Absolute: 0.4 10*3/uL (ref 0.1–0.9)
Monocytes: 9 %
Neutrophils Absolute: 1.6 10*3/uL (ref 1.4–7.0)
Neutrophils: 40 %
Platelets: 273 10*3/uL (ref 150–450)
RBC: 4.68 x10E6/uL (ref 3.77–5.28)
RDW: 12.7 % (ref 11.7–15.4)
WBC: 4.1 10*3/uL (ref 3.4–10.8)

## 2019-03-11 LAB — LIPID PANEL
Chol/HDL Ratio: 2.9 ratio (ref 0.0–4.4)
Cholesterol, Total: 283 mg/dL — ABNORMAL HIGH (ref 100–199)
HDL: 98 mg/dL (ref >39)
LDL Chol Calc (NIH): 173 mg/dL — ABNORMAL HIGH (ref 0–99)
Triglycerides: 77 mg/dL (ref 0–149)
VLDL Cholesterol Cal: 12 mg/dL (ref 5–40)

## 2019-03-11 LAB — HEMOGLOBIN A1C
Est. average glucose Bld gHb Est-mCnc: 117 mg/dL
Hgb A1c MFr Bld: 5.7 % — ABNORMAL HIGH (ref 4.8–5.6)

## 2019-03-16 ENCOUNTER — Encounter: Payer: Self-pay | Admitting: Registered Nurse

## 2019-03-16 DIAGNOSIS — E785 Hyperlipidemia, unspecified: Secondary | ICD-10-CM

## 2019-03-16 MED ORDER — ATORVASTATIN CALCIUM 20 MG PO TABS: 20.0000 mg | ORAL_TABLET | Freq: Every day | ORAL | 3 refills | Status: AC

## 2019-03-25 ENCOUNTER — Other Ambulatory Visit: Payer: Self-pay | Admitting: Family Medicine

## 2019-03-25 DIAGNOSIS — E039 Hypothyroidism, unspecified: Secondary | ICD-10-CM

## 2019-03-25 NOTE — Telephone Encounter (Signed)
Requested medication (s) are due for refill today:   Yes    Requested medication (s) are on the active medication list:   Yes  Future visit scheduled:   No   Has had 30 day courtesy refill   Last ordered: 02/24/2019  #30  0 refills   Requested Prescriptions  Pending Prescriptions Disp Refills   levothyroxine (SYNTHROID) 75 MCG tablet [Pharmacy Med Name: LEVOTHYROXINE 75 MCG TABLET] 30 tablet 0    Sig: TAKE 1 TABLET BY MOUTH EVERY DAY     Endocrinology:  Hypothyroid Agents Failed - 03/25/2019  2:32 PM      Failed - TSH needs to be rechecked within 3 months after an abnormal result. Refill until TSH is due.      Passed - TSH in normal range and within 360 days    TSH  Date Value Ref Range Status  03/10/2019 2.340 0.450 - 4.500 uIU/mL Final         Passed - Valid encounter within last 12 months    Recent Outpatient Visits          2 weeks ago Flu vaccine need   [facility], Delfino Lovett, NP   1 year ago Hypothyroidism, unspecified type   [facility], [place] D, PA-C   1 year ago Allergic dermatitis   [facility] [place], PA-C   1 year ago Sinus congestion   [facility] at [facility], Ines Bloomer, MD   1 year ago Hypothyroidism, unspecified type   [facility] at [place], [place] D, [place]

## 2019-03-29 ENCOUNTER — Other Ambulatory Visit: Payer: Self-pay | Admitting: Family Medicine

## 2019-03-29 DIAGNOSIS — E039 Hypothyroidism, unspecified: Secondary | ICD-10-CM

## 2019-04-23 ENCOUNTER — Other Ambulatory Visit: Payer: Self-pay | Admitting: Family Medicine

## 2019-04-23 DIAGNOSIS — E039 Hypothyroidism, unspecified: Secondary | ICD-10-CM

## 2019-05-04 ENCOUNTER — Other Ambulatory Visit: Payer: Self-pay | Admitting: Registered Nurse

## 2019-05-04 DIAGNOSIS — E039 Hypothyroidism, unspecified: Secondary | ICD-10-CM

## 2019-05-04 MED ORDER — LEVOTHYROXINE SODIUM 75 MCG PO TABS: 75.0000 ug | ORAL_TABLET | Freq: Every day | ORAL | 2 refills | Status: DC

## 2019-05-04 NOTE — Telephone Encounter (Signed)
Pt request refill  levothyroxine (SYNTHROID) 75 MCG tablet  90 day w/ refills. Pt states this is the way it has been for years, and does not understand why she only got 30 tabs w/ no refills  [facility] [id number] [facility] [address] [place], [place] Phone:  [phone]  Fax:  [phone]

## 2019-08-21 DIAGNOSIS — H2513 Age-related nuclear cataract, bilateral: Secondary | ICD-10-CM | POA: Diagnosis not present

## 2019-08-21 DIAGNOSIS — H5213 Myopia, bilateral: Secondary | ICD-10-CM | POA: Diagnosis not present

## 2019-11-27 DIAGNOSIS — L237 Allergic contact dermatitis due to plants, except food: Secondary | ICD-10-CM | POA: Diagnosis not present

## 2019-12-06 DIAGNOSIS — L42 Pityriasis rosea: Secondary | ICD-10-CM | POA: Diagnosis not present

## 2019-12-06 DIAGNOSIS — R21 Rash and other nonspecific skin eruption: Secondary | ICD-10-CM | POA: Diagnosis not present

## 2020-01-05 DIAGNOSIS — B86 Scabies: Secondary | ICD-10-CM | POA: Diagnosis not present

## 2020-01-15 ENCOUNTER — Other Ambulatory Visit: Payer: Self-pay | Admitting: Registered Nurse

## 2020-01-15 DIAGNOSIS — E039 Hypothyroidism, unspecified: Secondary | ICD-10-CM

## 2020-01-15 NOTE — Telephone Encounter (Signed)
[facility] refill has been sent in for pt's synthroid.

## 2020-01-26 ENCOUNTER — Encounter: Payer: Self-pay | Admitting: *Deleted

## 2020-03-07 DIAGNOSIS — L309 Dermatitis, unspecified: Secondary | ICD-10-CM | POA: Diagnosis not present

## 2020-03-13 ENCOUNTER — Other Ambulatory Visit: Payer: Self-pay | Admitting: Registered Nurse

## 2020-03-13 DIAGNOSIS — Z1231 Encounter for screening mammogram for malignant neoplasm of breast: Secondary | ICD-10-CM

## 2020-03-26 DIAGNOSIS — Z79899 Other long term (current) drug therapy: Secondary | ICD-10-CM | POA: Diagnosis not present

## 2020-03-26 DIAGNOSIS — L309 Dermatitis, unspecified: Secondary | ICD-10-CM | POA: Diagnosis not present

## 2020-03-26 DIAGNOSIS — R21 Rash and other nonspecific skin eruption: Secondary | ICD-10-CM | POA: Diagnosis not present

## 2020-04-09 ENCOUNTER — Other Ambulatory Visit: Payer: Self-pay | Admitting: Registered Nurse

## 2020-04-09 DIAGNOSIS — E039 Hypothyroidism, unspecified: Secondary | ICD-10-CM

## 2020-04-09 NOTE — Telephone Encounter (Signed)
Pt needs OV for additional refills. Refilled medication for 30 days. 

## 2020-04-09 NOTE — Telephone Encounter (Signed)
Requested medications are due for refill today yes  Requested medications are on the active medication list yes  Last refill 9/13  Last visit 03/2019  Future visit scheduled no  Notes to clinic Maximiano Coss already gave this pt a two month supply, no upcoming appt scheduled.

## 2020-04-09 NOTE — Telephone Encounter (Signed)
Pt just had CPE on 03/09/20. Will pt still need another visit? Looks like provider just wanted to see pt again for next years cpe. Please advise.

## 2020-04-19 DIAGNOSIS — D485 Neoplasm of uncertain behavior of skin: Secondary | ICD-10-CM | POA: Diagnosis not present

## 2020-04-19 DIAGNOSIS — L309 Dermatitis, unspecified: Secondary | ICD-10-CM | POA: Diagnosis not present

## 2020-04-30 DIAGNOSIS — L309 Dermatitis, unspecified: Secondary | ICD-10-CM | POA: Diagnosis not present

## 2020-04-30 DIAGNOSIS — Z79899 Other long term (current) drug therapy: Secondary | ICD-10-CM | POA: Diagnosis not present

## 2020-05-01 ENCOUNTER — Other Ambulatory Visit: Payer: Self-pay | Admitting: Registered Nurse

## 2020-05-01 DIAGNOSIS — E039 Hypothyroidism, unspecified: Secondary | ICD-10-CM

## 2020-06-03 ENCOUNTER — Other Ambulatory Visit: Payer: Self-pay | Admitting: Registered Nurse

## 2020-06-03 DIAGNOSIS — E039 Hypothyroidism, unspecified: Secondary | ICD-10-CM

## 2020-06-03 NOTE — Telephone Encounter (Signed)
Called patient to schedule appt for additional med refills. Patient stated she's going to go with a different provider.

## 2020-06-03 NOTE — Telephone Encounter (Signed)
Pt will need an OV for additional refills.

## 2020-06-03 NOTE — Telephone Encounter (Signed)
Requested medication (s) are due for refill today:   Yes  Requested medication (s) are on the active medication list:   Yes  Future visit scheduled:   No   Last ordered: 05/01/2020 #30, 0 refills  Clinic note.   Returned because needs an appt.  30 day courtesy refill was given last month.   PEC does not schedule for this practice.   Requested Prescriptions  Pending Prescriptions Disp Refills   levothyroxine (SYNTHROID) 75 MCG tablet [Pharmacy Med Name: LEVOTHYROXINE 75 MCG TABLET] 30 tablet 0    Sig: TAKE 1 TABLET BY MOUTH EVERY DAY      Endocrinology:  Hypothyroid Agents Failed - 06/03/2020 10:30 AM      Failed - TSH needs to be rechecked within 3 months after an abnormal result. Refill until TSH is due.      Failed - TSH in normal range and within 360 days    TSH  Date Value Ref Range Status  03/10/2019 2.340 0.450 - 4.500 uIU/mL Final          Failed - Valid encounter within last 12 months    Recent Outpatient Visits           1 year ago Flu vaccine need   [facility] [place], NP   2 years ago Hypothyroidism, unspecified type   [facility], [place], PA-C   2 years ago Allergic dermatitis   [facility] [place], PA-C   3 years ago Sinus congestion   [facility], [place], MD   3 years ago Hypothyroidism, unspecified type   [facility] [place], [place], [place]

## 2020-12-16 ENCOUNTER — Encounter: Payer: Self-pay | Admitting: Gastroenterology

## 2022-12-16 ENCOUNTER — Other Ambulatory Visit (HOSPITAL_COMMUNITY): Payer: Self-pay | Admitting: Family Medicine

## 2022-12-16 DIAGNOSIS — R6889 Other general symptoms and signs: Secondary | ICD-10-CM

## 2023-01-11 ENCOUNTER — Ambulatory Visit (HOSPITAL_COMMUNITY): Payer: 59 | Attending: Family Medicine

## 2023-01-11 DIAGNOSIS — R6889 Other general symptoms and signs: Secondary | ICD-10-CM | POA: Insufficient documentation

## 2023-01-11 DIAGNOSIS — I351 Nonrheumatic aortic (valve) insufficiency: Secondary | ICD-10-CM

## 2023-01-11 LAB — ECHOCARDIOGRAM COMPLETE
Area-P 1/2: 3.85 cm2
S' Lateral: 3.1 cm

## 2023-01-21 ENCOUNTER — Other Ambulatory Visit: Payer: Self-pay | Admitting: Family Medicine

## 2023-01-21 DIAGNOSIS — R0602 Shortness of breath: Secondary | ICD-10-CM

## 2023-01-25 ENCOUNTER — Other Ambulatory Visit: Payer: Self-pay | Admitting: Family Medicine

## 2023-01-25 DIAGNOSIS — E041 Nontoxic single thyroid nodule: Secondary | ICD-10-CM

## 2023-01-26 ENCOUNTER — Ambulatory Visit
Admission: RE | Admit: 2023-01-26 | Discharge: 2023-01-26 | Disposition: A | Discharge status: Home / Self Care | Payer: 59 | Source: Ambulatory Visit | Attending: Family Medicine | Admitting: Family Medicine

## 2023-01-26 ENCOUNTER — Other Ambulatory Visit: Payer: 59

## 2023-01-26 DIAGNOSIS — E041 Nontoxic single thyroid nodule: Secondary | ICD-10-CM

## 2023-01-26 DIAGNOSIS — R0602 Shortness of breath: Secondary | ICD-10-CM

## 2023-01-26 MED ORDER — IOPAMIDOL (ISOVUE-300) INJECTION 61%
75.0000 mL | Freq: Once | INTRAVENOUS | Status: AC | PRN
  Administered 2023-01-26: 75 mL via INTRAVENOUS

## 2023-02-04 ENCOUNTER — Other Ambulatory Visit: Payer: 59
